# Patient Record
Sex: Male | Born: 1995 | Race: White | Hispanic: No | Marital: Single | Smoking: Never smoker
Health system: Southern US, Community
[De-identification: ages and names within clinical notes are randomized; demographics above are authoritative.]

## PROBLEM LIST (undated history)

## (undated) DIAGNOSIS — F988 Other specified behavioral and emotional disorders with onset usually occurring in childhood and adolescence: Secondary | ICD-10-CM

## (undated) HISTORY — DX: Other specified behavioral and emotional disorders with onset usually occurring in childhood and adolescence: F98.8

---
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
## 2018-10-13 ENCOUNTER — Emergency Department: Payer: BLUE CROSS/BLUE SHIELD

## 2018-10-13 ENCOUNTER — Other Ambulatory Visit: Payer: Self-pay

## 2018-10-13 ENCOUNTER — Encounter: Payer: Self-pay | Admitting: Emergency Medicine

## 2018-10-13 ENCOUNTER — Emergency Department
Admission: EM | Admit: 2018-10-13 | Discharge: 2018-10-13 | Disposition: A | Discharge status: Home / Self Care | Payer: BLUE CROSS/BLUE SHIELD | Attending: Emergency Medicine | Admitting: Emergency Medicine

## 2018-10-13 DIAGNOSIS — T3995XA Adverse effect of unspecified nonopioid analgesic, antipyretic and antirheumatic, initial encounter: Secondary | ICD-10-CM | POA: Diagnosis not present

## 2018-10-13 DIAGNOSIS — K5900 Constipation, unspecified: Secondary | ICD-10-CM | POA: Diagnosis present

## 2018-10-13 DIAGNOSIS — K5903 Drug induced constipation: Secondary | ICD-10-CM | POA: Diagnosis not present

## 2018-10-13 LAB — COMPREHENSIVE METABOLIC PANEL
ALK PHOS: 55 U/L (ref 38–126)
ALT: 108 U/L — ABNORMAL HIGH (ref 0–44)
AST: 68 U/L — ABNORMAL HIGH (ref 15–41)
Albumin: 4.6 g/dL (ref 3.5–5.0)
Anion gap: 9 (ref 5–15)
BUN: 17 mg/dL (ref 6–20)
CO2: 27 mmol/L (ref 22–32)
Calcium: 9.4 mg/dL (ref 8.9–10.3)
Chloride: 101 mmol/L (ref 98–111)
Creatinine, Ser: 1.13 mg/dL (ref 0.61–1.24)
GFR calc Af Amer: >60 mL/min (ref >60)
GFR calc non Af Amer: >60 mL/min (ref >60)
Glucose, Bld: 94 mg/dL (ref 70–99)
Potassium: 3.6 mmol/L (ref 3.5–5.1)
Sodium: 137 mmol/L (ref 135–145)
Total Bilirubin: 1.2 mg/dL (ref 0.3–1.2)
Total Protein: 8.2 g/dL — ABNORMAL HIGH (ref 6.5–8.1)

## 2018-10-13 LAB — CBC
HCT: 49.9 % (ref 39.0–52.0)
Hemoglobin: 17.3 g/dL — ABNORMAL HIGH (ref 13.0–17.0)
MCH: 31.1 pg (ref 26.0–34.0)
MCHC: 34.7 g/dL (ref 30.0–36.0)
MCV: 89.7 fL (ref 80.0–100.0)
Platelets: 282 10*3/uL (ref 150–400)
RBC: 5.56 MIL/uL (ref 4.22–5.81)
RDW: 11.8 % (ref 11.5–15.5)
WBC: 14.3 10*3/uL — ABNORMAL HIGH (ref 4.0–10.5)
nRBC: 0 % (ref 0.0–0.2)

## 2018-10-13 LAB — LIPASE, BLOOD: Lipase: 47 U/L (ref 11–51)

## 2018-10-13 MED ORDER — MAGNESIUM CITRATE PO SOLN
1.0000 | Freq: Once | ORAL | Status: AC
  Administered 2018-10-13: 1 via ORAL

## 2018-10-13 MED ORDER — MAGNESIUM CITRATE PO SOLN
1.0000 | Freq: Once | ORAL | Status: AC
  Administered 2018-10-13: 1 via ORAL
  Filled 2018-10-13: qty 296

## 2018-10-13 MED ORDER — POLYETHYLENE GLYCOL 3350 17 G PO PACK: 17.0000 g | PACK | Freq: Every day | ORAL | 0 refills | Status: DC

## 2018-10-13 MED ORDER — MAGNESIUM CITRATE PO SOLN
ORAL | Status: AC
  Administered 2018-10-13: 1 via ORAL
  Filled 2018-10-13: qty 296

## 2018-10-13 MED ORDER — DOCUSATE SODIUM 100 MG PO CAPS
100.0000 mg | ORAL_CAPSULE | Freq: Once | ORAL | Status: AC
  Administered 2018-10-13: 100 mg via ORAL

## 2018-10-13 MED ORDER — DOCUSATE SODIUM 100 MG PO CAPS
ORAL_CAPSULE | ORAL | Status: AC
  Administered 2018-10-13: 100 mg via ORAL
  Filled 2018-10-13: qty 1

## 2018-10-13 NOTE — ED Notes (Signed)
Pt has left shoulder surgery 5 days ago.  Pt is wearing a brace/sling to left shoulder.  Pt reports he has been taking percocet and has had constipation and diarrhea.  Pt states he now has rectal pain from straining. Vomited x 1.  Pt used enema x 1 with no results.  Pt denies abd pain.  Family with pt.

## 2018-10-13 NOTE — ED Notes (Signed)
Pt verbalized a desire to go home. Papers printed and instructions given on home care. Parents verbalized understanding. Pt ambulatory.

## 2018-10-13 NOTE — ED Notes (Signed)
Enema given pt was able to tolerate half the soap suds enema. Pt attempting BM at this time.

## 2018-10-13 NOTE — ED Triage Notes (Signed)
Diarrhea x 2 days, nausea with vomiting today. Denies abdominal. Has not been around anyone sick. L shoulder surgery in [place] 4 days ago.

## 2018-10-13 NOTE — ED Provider Notes (Signed)
[facility] Emergency Department Provider Note   ____________________________________________    I have reviewed the triage vital signs and the nursing notes.   HISTORY  Chief Complaint Diarrhea     HPI Jerry Collins is a 22 y.o. male who presents with complaints of constipation.  Patient reports he had shoulder surgery 4 days ago, has been on pain medication and has been taking laxatives as prescribed.  Over the last day he has felt like he has had to strain significantly and feels as though he may have a hard ball of stool in his rectum which looser stool is going around.  Denies nausea or vomiting.  No fevers.  Tried an enema at home unsuccessfully   Past Medical History:  Diagnosis Date  . ADD (attention deficit disorder)     There are no active problems to display for this patient.   History reviewed. No pertinent surgical history.  Prior to Admission medications   Medication Sig Start Date End Date Taking? Authorizing Provider  dexmethylphenidate (FOCALIN) 10 MG tablet Take 10 mg by mouth 2 (two) times daily.    [provider]  polyethylene glycol (MIRALAX) packet Take 17 g by mouth daily. 10/13/18   Jene Every, MD  terbinafine (LAMISIL) 250 MG tablet Take 1 tablet (250 mg total) by mouth daily. 06/11/14   Lenn Sink, DPM     Allergies Patient has no known allergies.  No family history on file.  Social History Social History   Tobacco Use  . Smoking status: Never Smoker  . Smokeless tobacco: Never Used  Substance Use Topics  . Alcohol use: Not on file  . Drug use: Not on file    Review of Systems  Constitutional: No fever/chills Eyes: No visual changes.  ENT: No sore throat. Cardiovascular: Denies chest pain. Respiratory: Denies shortness of breath. Gastrointestinal: No abdominal pain, constipation as above Genitourinary: No difficulty urinating Musculoskeletal: Shoulder pain status post surgery Skin:  Negative for rash. Neurological: Negative for headaches    ____________________________________________   PHYSICAL EXAM:  VITAL SIGNS: ED Triage Vitals  Enc Vitals Group     BP 10/13/18 1911 (!) 153/91     Pulse Rate 10/13/18 1911 100     Resp 10/13/18 1911 20     Temp 10/13/18 1911 98.5 F (36.9 C)     Temp Source 10/13/18 1911 Oral     SpO2 10/13/18 1911 100 %     Weight 10/13/18 1912 122.5 kg (270 lb)     Height 10/13/18 1912 1.829 m (6')     Head Circumference --      Peak Flow --      Pain Score 10/13/18 1911 0     Pain Loc --      Pain Edu? --      Excl. in GC? --     Constitutional: Alert and oriented. No acute distress.  Eyes: Conjunctivae are normal.   Nose: No congestion/rhinnorhea. Mouth/Throat: Mucous membranes are moist.    Cardiovascular: Normal rate, regular rhythm. Grossly normal heart sounds.  Good peripheral circulation. Respiratory: Normal respiratory effort.  No retractions. Lungs CTAB. Gastrointestinal: Soft and nontender. No distention.  Hemorrhoid 6:00, nonbleeding  Musculoskeletal:   Warm and well perfused Neurologic:  Normal speech and language. No gross focal neurologic deficits are appreciated.  Skin:  Skin is warm, dry and intact. No rash noted. Psychiatric: Mood and affect are normal. Speech and behavior are normal.  ____________________________________________   LABS (  all labs ordered are listed, but only abnormal results are displayed)  Labs Reviewed  COMPREHENSIVE METABOLIC PANEL - Abnormal; Notable for the following components:      Result Value   Total Protein 8.2 (*)    AST 68 (*)    ALT 108 (*)    All other components within normal limits  CBC - Abnormal; Notable for the following components:   WBC 14.3 (*)    Hemoglobin 17.3 (*)    All other components within normal limits  LIPASE, BLOOD   ____________________________________________  EKG  None ____________________________________________  RADIOLOGY  Chest  x-ray demonstrates moderate large bowel stool ____________________________________________   PROCEDURES  Procedure(s) performed: no     ------------------------------------------------------------------------------------------------------------------    Procedures   Critical Care performed: No ____________________________________________   INITIAL IMPRESSION / ASSESSMENT AND PLAN / ED COURSE  Pertinent labs & imaging results that were available during my care of the patient were reviewed by me and considered in my medical decision making (see chart for details).  Patient presents with complaints of constipation, he is worried about possible impaction.  KUB demonstrates moderate amount of stool, will give enema.  Attempted manual disimpaction unsuccessfully, patient unable to tolerate.  P.o. magnesium citrate given    ____________________________________________   FINAL CLINICAL IMPRESSION(S) / ED DIAGNOSES  Final diagnoses:  Drug-induced constipation        Note:  This document was prepared using Dragon voice recognition software and may include unintentional dictation errors.    Jene EveryKinner, Derrion Tritz, MD 10/13/18 2242

## 2020-01-03 ENCOUNTER — Ambulatory Visit: Payer: Self-pay | Attending: Internal Medicine

## 2020-01-03 DIAGNOSIS — Z23 Encounter for immunization: Secondary | ICD-10-CM | POA: Insufficient documentation

## 2020-01-03 NOTE — Progress Notes (Signed)
   Covid-19 Vaccination Clinic  Name:  DAMIEAN LUKES    MRN: [id number] DOB: 28-Apr-1996  01/03/2020  Mr. Schleicher was observed post Covid-19 immunization for 15 minutes without incidence. He was provided with Vaccine Information Sheet and instruction to access the V-Safe system.   Mr. Gascoigne was instructed to call 911 with any severe reactions post vaccine: Marland Kitchen Difficulty breathing  . Swelling of your face and throat  . A fast heartbeat  . A bad rash all over your body  . Dizziness and weakness    Immunizations Administered    Name Date Dose VIS Date Route   Pfizer COVID-19 Vaccine 01/03/2020  4:34 PM 0.3 mL 10/16/2019 Intramuscular   Manufacturer: [employer], [employer]   Lot: [id number]   NDC: [address]-9

## 2020-01-26 ENCOUNTER — Ambulatory Visit: Payer: Self-pay | Attending: Internal Medicine

## 2020-01-26 DIAGNOSIS — Z23 Encounter for immunization: Secondary | ICD-10-CM

## 2020-01-26 NOTE — Progress Notes (Signed)
   Covid-19 Vaccination Clinic  Name:  Jerry Collins    MRN: [id number] DOB: [birth year]  01/26/2020  Mr. Hrdlicka was observed post Covid-19 immunization for 15 minutes without incident. He was provided with Vaccine Information Sheet and instruction to access the V-Safe system.   Mr. Dunnam was instructed to call 911 with any severe reactions post vaccine: Marland Kitchen Difficulty breathing  . Swelling of face and throat  . A fast heartbeat  . A bad rash all over body  . Dizziness and weakness   Immunizations Administered    Name Date Dose VIS Date Route   Pfizer COVID-19 Vaccine 01/26/2020  1:52 PM 0.3 mL 10/16/2019 Intramuscular   Manufacturer: [employer], [employer]   Lot: [id number]   NDC: [address]-5

## 2020-08-13 ENCOUNTER — Encounter: Payer: Self-pay | Admitting: Emergency Medicine

## 2020-08-13 ENCOUNTER — Other Ambulatory Visit: Payer: Self-pay

## 2020-08-13 DIAGNOSIS — R1032 Left lower quadrant pain: Secondary | ICD-10-CM | POA: Insufficient documentation

## 2020-08-13 DIAGNOSIS — E876 Hypokalemia: Secondary | ICD-10-CM | POA: Diagnosis not present

## 2020-08-13 NOTE — ED Triage Notes (Addendum)
Pt presents to ER from home with complaints of LLQ pain since Thursday, pt reports he has had normal BM today reports pain comes and goes denies any injury to area. Pt denies any nausea, vomiting or diarrhea. Pt talks in complete sentences no distress noted.

## 2020-08-14 ENCOUNTER — Emergency Department
Admission: EM | Admit: 2020-08-14 | Discharge: 2020-08-14 | Disposition: A | Discharge status: Home / Self Care | Payer: BC Managed Care – PPO | Attending: Emergency Medicine | Admitting: Emergency Medicine

## 2020-08-14 ENCOUNTER — Emergency Department: Payer: BC Managed Care – PPO

## 2020-08-14 DIAGNOSIS — R1032 Left lower quadrant pain: Secondary | ICD-10-CM

## 2020-08-14 LAB — COMPREHENSIVE METABOLIC PANEL
ALT: 30 U/L (ref 0–44)
AST: 17 U/L (ref 15–41)
Albumin: 4.6 g/dL (ref 3.5–5.0)
Alkaline Phosphatase: 56 U/L (ref 38–126)
Anion gap: 8 (ref 5–15)
BUN: 13 mg/dL (ref 6–20)
CO2: 25 mmol/L (ref 22–32)
Calcium: 8.7 mg/dL — ABNORMAL LOW (ref 8.9–10.3)
Chloride: 106 mmol/L (ref 98–111)
Creatinine, Ser: 1.05 mg/dL (ref 0.61–1.24)
GFR, Estimated: >60 mL/min (ref >60)
Glucose, Bld: 94 mg/dL (ref 70–99)
Potassium: 3.3 mmol/L — ABNORMAL LOW (ref 3.5–5.1)
Sodium: 139 mmol/L (ref 135–145)
Total Bilirubin: 1.7 mg/dL — ABNORMAL HIGH (ref 0.3–1.2)
Total Protein: 7.8 g/dL (ref 6.5–8.1)

## 2020-08-14 LAB — URINALYSIS, COMPLETE (UACMP) WITH MICROSCOPIC
Bilirubin Urine: NEGATIVE
Glucose, UA: NEGATIVE mg/dL
Hgb urine dipstick: NEGATIVE
Ketones, ur: NEGATIVE mg/dL
Leukocytes,Ua: NEGATIVE
Nitrite: NEGATIVE
Protein, ur: NEGATIVE mg/dL
Specific Gravity, Urine: 1.026 (ref 1.005–1.030)
Squamous Epithelial / LPF: NONE SEEN (ref 0–5)
pH: 5 (ref 5.0–8.0)

## 2020-08-14 LAB — CBC
HCT: 41.1 % (ref 39.0–52.0)
Hemoglobin: 15 g/dL (ref 13.0–17.0)
MCH: 31.8 pg (ref 26.0–34.0)
MCHC: 36.5 g/dL — ABNORMAL HIGH (ref 30.0–36.0)
MCV: 87.3 fL (ref 80.0–100.0)
Platelets: 184 10*3/uL (ref 150–400)
RBC: 4.71 MIL/uL (ref 4.22–5.81)
RDW: 12 % (ref 11.5–15.5)
WBC: 9.7 10*3/uL (ref 4.0–10.5)
nRBC: 0 % (ref 0.0–0.2)

## 2020-08-14 LAB — LIPASE, BLOOD: Lipase: 27 U/L (ref 11–51)

## 2020-08-14 MED ORDER — TRAMADOL HCL 50 MG PO TABS: 50.0000 mg | ORAL_TABLET | Freq: Four times a day (QID) | ORAL | 0 refills | Status: DC | PRN

## 2020-08-14 MED ORDER — SODIUM CHLORIDE 0.9 % IV BOLUS
1000.0000 mL | Freq: Once | INTRAVENOUS | Status: AC
  Administered 2020-08-14: 1000 mL via INTRAVENOUS

## 2020-08-14 MED ORDER — IOHEXOL 300 MG/ML  SOLN
100.0000 mL | Freq: Once | INTRAMUSCULAR | Status: AC | PRN
  Administered 2020-08-14: 100 mL via INTRAVENOUS
  Filled 2020-08-14: qty 100

## 2020-08-14 MED ORDER — CIPROFLOXACIN HCL 500 MG PO TABS: 500.0000 mg | ORAL_TABLET | Freq: Two times a day (BID) | ORAL | 0 refills | Status: DC

## 2020-08-14 MED ORDER — METRONIDAZOLE 500 MG PO TABS: 500.0000 mg | ORAL_TABLET | Freq: Three times a day (TID) | ORAL | 0 refills | Status: AC

## 2020-08-14 MED ORDER — FENTANYL CITRATE (PF) 100 MCG/2ML IJ SOLN
100.0000 ug | Freq: Once | INTRAMUSCULAR | Status: AC
  Administered 2020-08-14: 100 ug via INTRAVENOUS
  Filled 2020-08-14: qty 2

## 2020-08-14 MED ORDER — IOHEXOL 9 MG/ML PO SOLN
500.0000 mL | Freq: Two times a day (BID) | ORAL | Status: DC | PRN
  Administered 2020-08-14: 500 mL via ORAL
  Filled 2020-08-14 (×2): qty 500

## 2020-08-14 MED ORDER — ONDANSETRON HCL 4 MG/2ML IJ SOLN
4.0000 mg | Freq: Once | INTRAMUSCULAR | Status: AC
  Administered 2020-08-14: 4 mg via INTRAVENOUS
  Filled 2020-08-14: qty 2

## 2020-08-14 NOTE — Discharge Instructions (Signed)
Please call and schedule a follow up appointment with the GI specialist.   Take the antibiotic as prescribed and until finished.   Return to the ER for symptoms that change or worsen if unable to see primary care or the GI specialist.

## 2020-08-14 NOTE — ED Provider Notes (Signed)
[facility] Emergency Department Provider Note ____________________________________________   First MD Initiated Contact with Patient 08/14/20 1112     (approximate)  I have reviewed the triage vital signs and the nursing notes.   HISTORY  Chief Complaint Abdominal Pain  HPI Jerry Collins is a 24 y.o. male with no chronic medical history presents to the emergency department for treatment and evaluation of left lower quadrant pain that started 2 days ago.  Patient describes the pain as stabbing.  He denies nausea or vomiting or diarrhea.  No previous symptoms of the same. He denies fever. Last BM was yesterday. He initially thought pain was related to gas, but tried Gax X without relief.          Past Medical History:  Diagnosis Date  . ADD (attention deficit disorder)     There are no problems to display for this patient.   History reviewed. No pertinent surgical history.  Prior to Admission medications   Medication Sig Start Date End Date Taking? Authorizing Provider  dexmethylphenidate (FOCALIN) 10 MG tablet Take 10 mg by mouth 2 (two) times daily.    [provider]  polyethylene glycol (MIRALAX) packet Take 17 g by mouth daily. 10/13/18   Jene Every, MD  terbinafine (LAMISIL) 250 MG tablet Take 1 tablet (250 mg total) by mouth daily. 06/11/14   Lenn Sink, DPM    Allergies Patient has no known allergies.  No family history on file.  Social History Social History   Tobacco Use  . Smoking status: Never Smoker  . Smokeless tobacco: Never Used  Substance Use Topics  . Alcohol use: Not on file  . Drug use: Not on file    Review of Systems  Constitutional: No fever/chills Eyes: No visual changes. ENT: No sore throat. Cardiovascular: Denies chest pain. Respiratory: Denies shortness of breath. Gastrointestinal: Positive for abdominal pain.  No nausea, no vomiting.  No diarrhea.  No constipation. Genitourinary: Negative  for dysuria. Musculoskeletal: Negative for back pain. Skin: Negative for rash. Neurological: Negative for headaches, focal weakness or numbness. ____________________________________________   PHYSICAL EXAM:  VITAL SIGNS: ED Triage Vitals  Enc Vitals Group     BP 08/13/20 2351 131/83     Pulse Rate 08/13/20 2351 98     Resp 08/13/20 2351 20     Temp 08/13/20 2351 99.8 F (37.7 C)     Temp Source 08/13/20 2351 Oral     SpO2 08/13/20 2351 97 %     Weight 08/13/20 2352 300 lb (136.1 kg)     Height 08/13/20 2352 6' (1.829 m)     Head Circumference --      Peak Flow --      Pain Score 08/13/20 2351 6     Pain Loc --      Pain Edu? --      Excl. in GC? --     Constitutional: Alert and oriented. Well appearing and in no acute distress. Eyes: Conjunctivae are normal.  Head: Atraumatic. Nose: No congestion/rhinnorhea. Mouth/Throat: Mucous membranes are moist. Oropharynx non-erythematous. Neck: No stridor.   Hematological/Lymphatic/Immunilogical: No cervical lymphadenopathy. Cardiovascular: Normal rate, regular rhythm. Grossly normal heart sounds.  Good peripheral circulation. Respiratory: Normal respiratory effort.  No retractions. Lungs CTAB. Gastrointestinal: Soft and nontender. No distention. No abdominal bruits. No CVA tenderness. Genitourinary: Exam deferred. Musculoskeletal: No lower extremity tenderness nor edema.  No joint effusions. Neurologic:  Normal speech and language. No gross focal neurologic deficits are appreciated.  No gait instability. Skin:  Skin is warm, dry and intact. No rash noted. Psychiatric: Mood and affect are normal. Speech and behavior are normal.  ____________________________________________   LABS (all labs ordered are listed, but only abnormal results are displayed)  Labs Reviewed  COMPREHENSIVE METABOLIC PANEL - Abnormal; Notable for the following components:      Result Value   Potassium 3.3 (*)    Calcium 8.7 (*)    Total Bilirubin 1.7  (*)    All other components within normal limits  CBC - Abnormal; Notable for the following components:   MCHC 36.5 (*)    All other components within normal limits  URINALYSIS, COMPLETE (UACMP) WITH MICROSCOPIC - Abnormal; Notable for the following components:   Color, Urine YELLOW (*)    APPearance CLOUDY (*)    Bacteria, UA RARE (*)    All other components within normal limits  LIPASE, BLOOD   ____________________________________________  EKG  Not indicated. ____________________________________________  RADIOLOGY  ED MD interpretation:    Focal diverticulitis v/s colitis found on CT abdomen and pelvis.  I, Kem Boroughs, personally viewed and evaluated these images (plain radiographs) as part of my medical decision making, as well as reviewing the written report by the radiologist.  Official radiology report(s): No results found.  ____________________________________________   PROCEDURES  Procedure(s) performed (including Critical Care):  Procedures  ____________________________________________   INITIAL IMPRESSION / ASSESSMENT AND PLAN     24 year old male presenting to the emergency department for treatment and evaluation of sudden onset left lower quadrant pain.  See HPI for further details.  Vital signs are reassuring.  Patient is not febrile or tachycardic.  Plan will be to review lab studies that were obtained while awaiting ER room assignment and decide a plan of care.  DIFFERENTIAL DIAGNOSIS  Diverticulitis, diverticulosis, colitis, constipation, partial/small bowel obstruction  ED COURSE  Lab studies are reassuring.  There is no leukocytosis.  He does have very mild hypokalemia as well as a slight bump in the total bilirubin.  Lipase is normal.  Vital signs have remained stable throughout his time in the emergency department.  CT scan of the abdomen and pelvis with oral and IV contrast does show concern for focal diverticulitis or focal colitis.  He  will be treated with Cipro and Flagyl and encouraged to schedule a follow-up appointment with gastroenterologist.  He was advised to return to the emergency department for symptoms of change or worsen if he is unable to see his primary care provider or the specialist.    ___________________________________________   FINAL CLINICAL IMPRESSION(S) / ED DIAGNOSES  Final diagnoses:  None     ED Discharge Orders    None       Jerry Collins was evaluated in Emergency Department on 08/14/2020 for the symptoms described in the history of present illness. He was evaluated in the context of the global COVID-19 pandemic, which necessitated consideration that the patient might be at risk for infection with the SARS-CoV-2 virus that causes COVID-19. Institutional protocols and algorithms that pertain to the evaluation of patients at risk for COVID-19 are in a state of rapid change based on information released by regulatory bodies including the CDC and federal and state organizations. These policies and algorithms were followed during the patient's care in the ED.   Note:  This document was prepared using Dragon voice recognition software and may include unintentional dictation errors.   Chinita Pester, FNP 08/14/20 1755    Shaune Pollack,  MD 08/14/20 2135

## 2020-08-14 NOTE — ED Provider Notes (Signed)
Medical screening examination/treatment/procedure(s) were conducted as a shared visit with non-physician practitioner(s) and myself.  I personally evaluated the patient during the encounter. Briefly, the patient is a 24 yo M here with LLQ abd pain. Labs overall reassuring, pt well appearing on exam. CT scan obtained 2/2 focal TTP reviewed by me, c/w left-sided colitis vs diverticulitis. Pt did report prior ABX use but sx are less c/w C. Diff and he has no leukocytosis, fever. Will treat, d/c with good return precautions.     Shaune Pollack, MD 08/14/20 2131

## 2020-08-14 NOTE — ED Notes (Signed)
Called CT to pick pt up for scan

## 2021-06-30 IMAGING — CT CT ABD-PELV W/ CM
2 of 4 series · 16 of 46 positions shown, 18 images · IV contrast (APPLIED)
Comparison: None.

CLINICAL DATA: Left lower quadrant abdomen pain since [REDACTED].
Assess for diverticulitis.

EXAM:
CT ABDOMEN AND PELVIS WITH CONTRAST
TECHNIQUE: Multidetector CT imaging of the abdomen and pelvis was performed
using the standard protocol following bolus administration of
intravenous contrast.
CONTRAST:  100mL OMNIPAQUE IOHEXOL 300 MG/ML  SOLN

[Series 2: axial st · axial · 0.83mm/px · z∈[-1125,-615]mm · 13 of 112 slices shown, 15 images]
[im 5/112  soft-tissue]
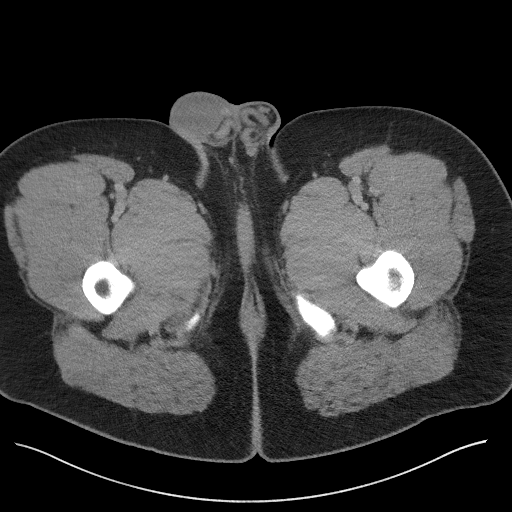
[im 5/112  bone]
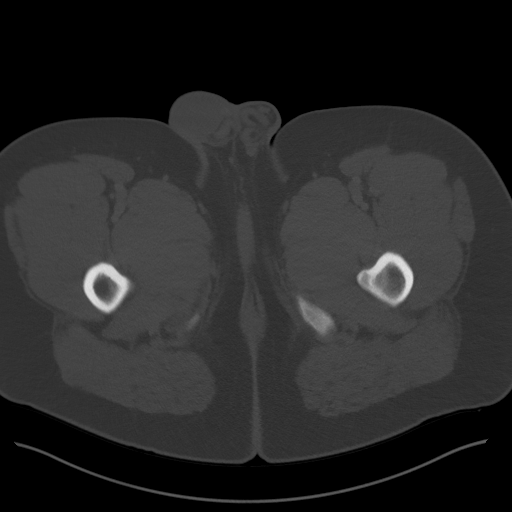
[im 14/112  soft-tissue]
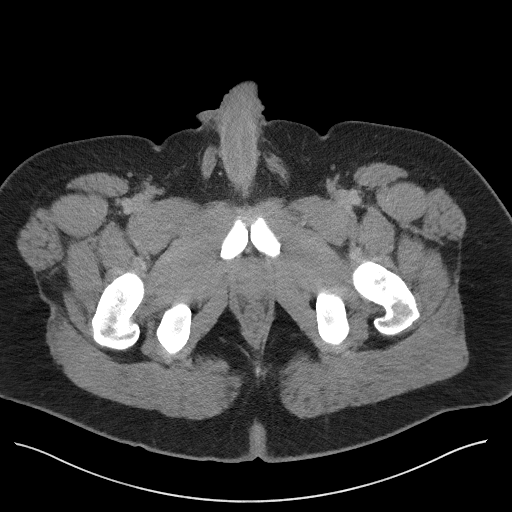
[im 24/112  soft-tissue]
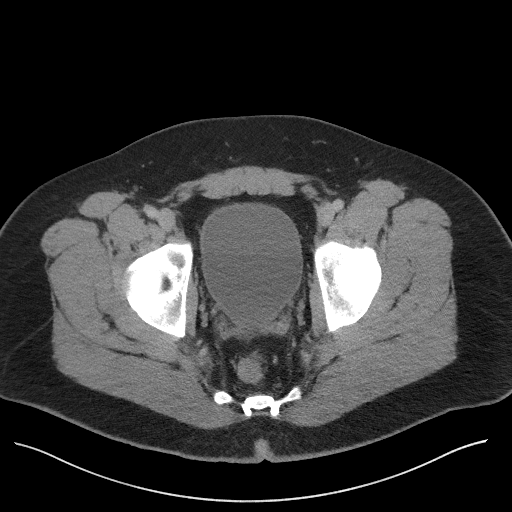
[im 33/112  soft-tissue]
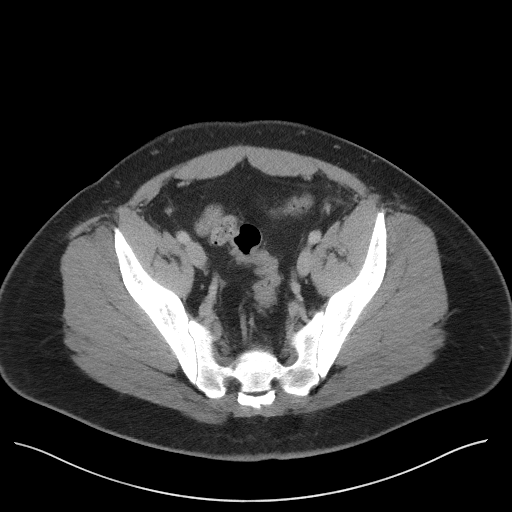
[im 38/112  soft-tissue]
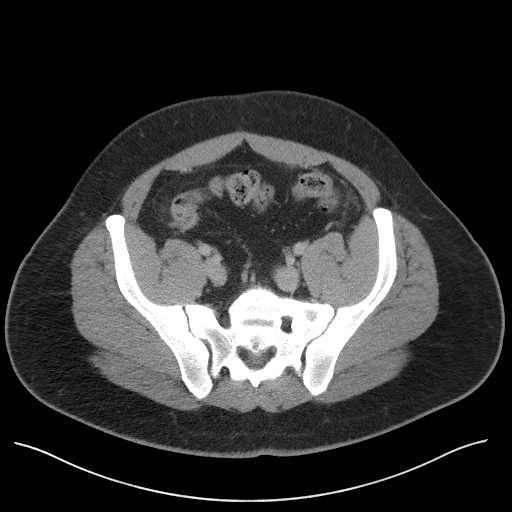
[im 47/112  soft-tissue]
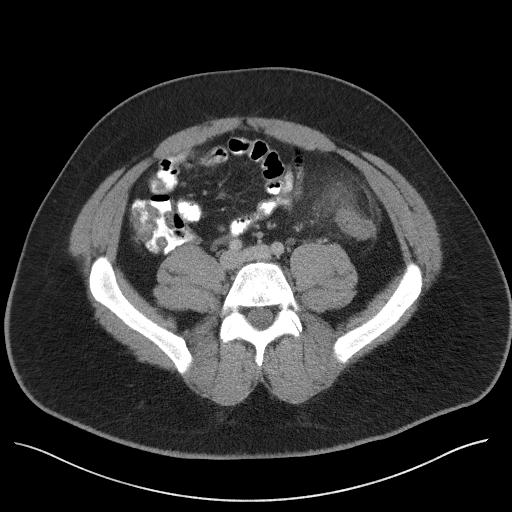
[im 56/112  soft-tissue]
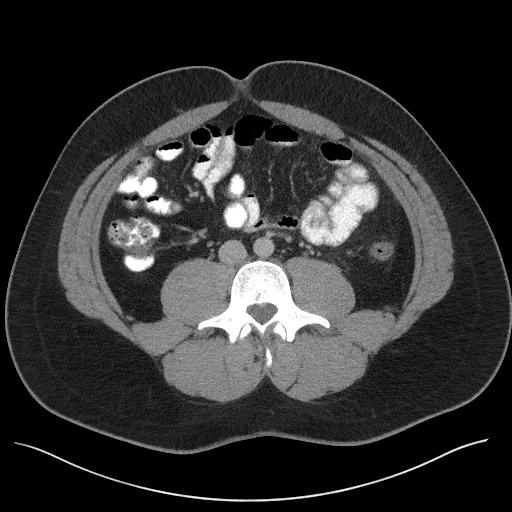
[im 65/112  soft-tissue]
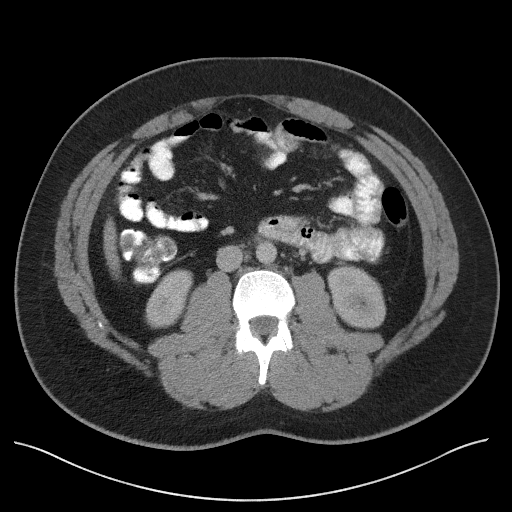
[im 75/112  soft-tissue]
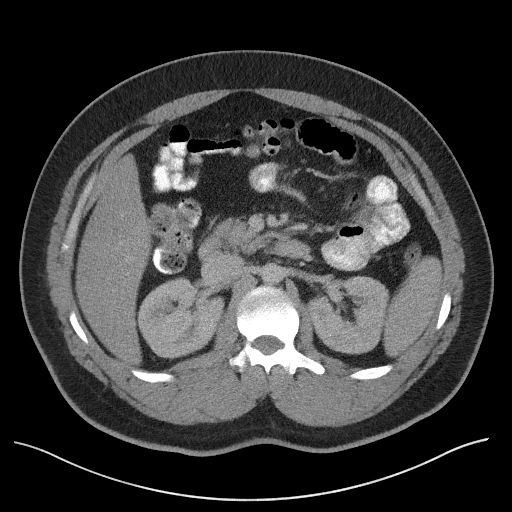
[im 75/112  bone]
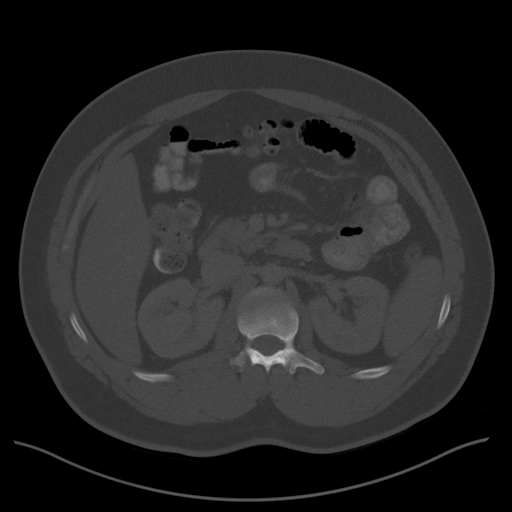
[im 79/112  soft-tissue]
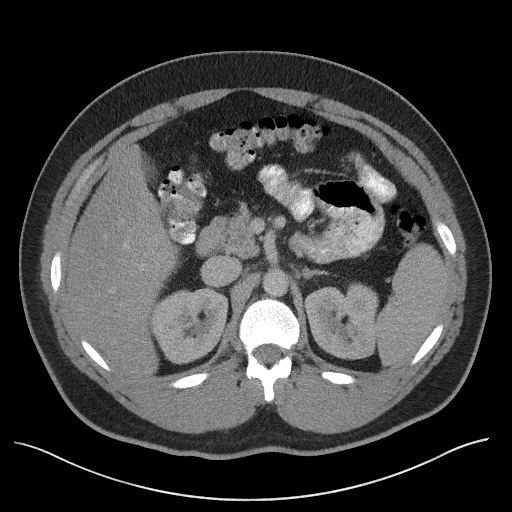
[im 88/112  soft-tissue]
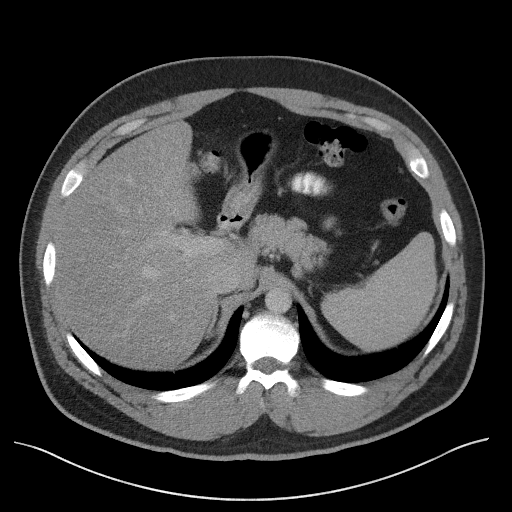
[im 98/112  soft-tissue]
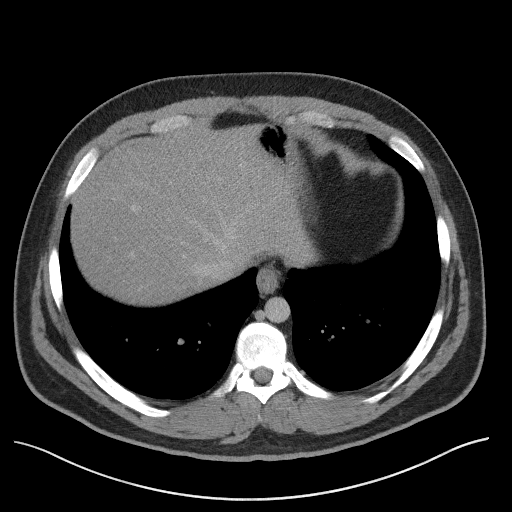
[im 107/112  soft-tissue]
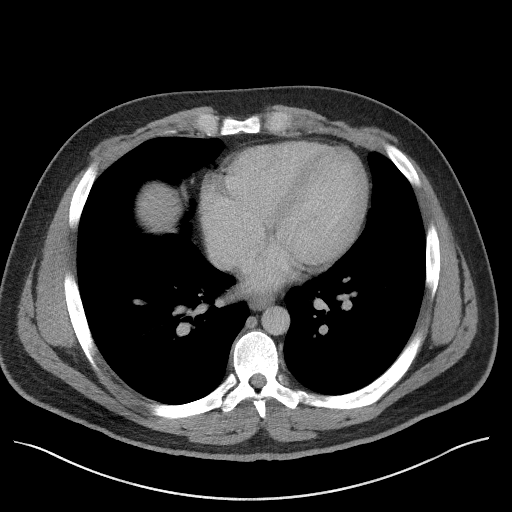

[Series 5: coronal st · coronal · 0.95mm/px · 3 of 116 slices shown]
[im 39/116  soft-tissue]
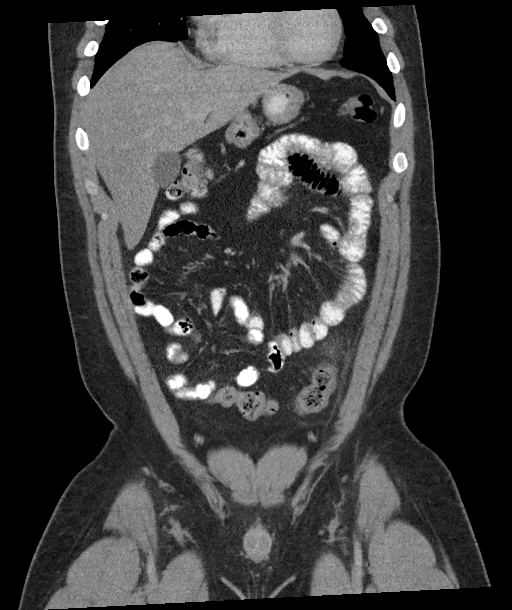
[im 52/116  soft-tissue]
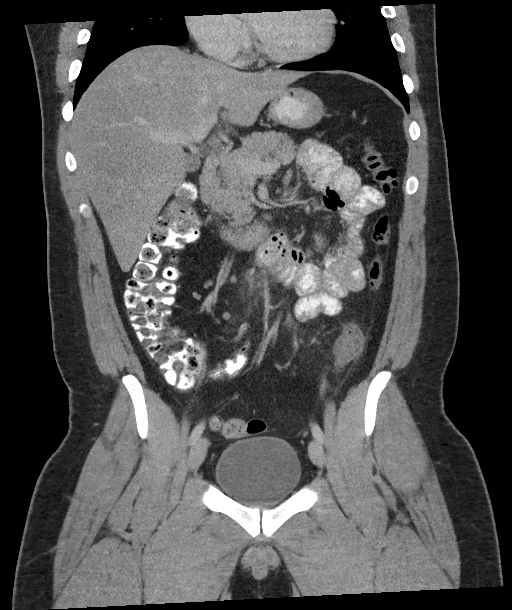
[im 64/116  soft-tissue]
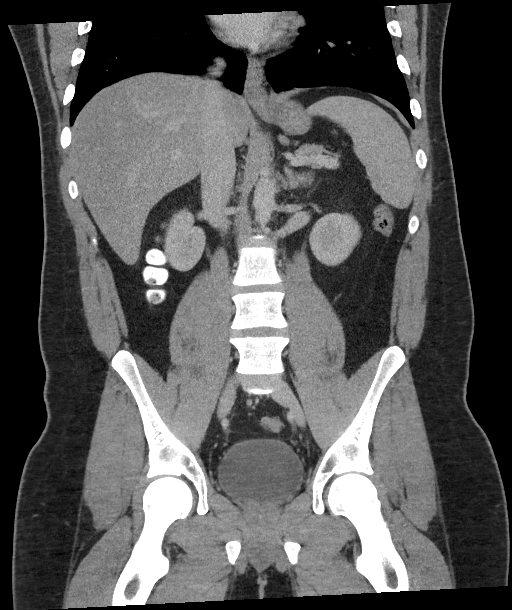

[16 of 46 positions shown; findings below may reference images not displayed]

FINDINGS: Lower chest: No acute abnormality.

Hepatobiliary: Diffuse low density of the liver is identified. No
focal liver lesion is noted. The gallbladder is normal. The biliary
tree is normal.

Pancreas: Unremarkable. No pancreatic ductal dilatation or
surrounding inflammatory changes.

Spleen: Normal in size without focal abnormality.

Adrenals/Urinary Tract: Adrenal glands are unremarkable. Kidneys are
normal, without renal calculi, focal lesion, or hydronephrosis.
Bladder is unremarkable.

Stomach/Bowel: There is inflammation surrounding the proximal
sigmoid colon. There is no small bowel obstruction. The appendix is
normal. No significant colonic diverticular disease is noted.

Vascular/Lymphatic: No significant vascular findings are present. No
enlarged abdominal or pelvic lymph nodes.

Reproductive: Prostate is unremarkable.

Other: Minimal umbilical herniation of mesenteric fat is identified.

Musculoskeletal: No acute or significant osseous findings.
IMPRESSION: 1. Inflammation surrounding the proximal sigmoid colon either due to
focal diverticulitis or focal colitis.
2. Fatty infiltration of liver.

## 2022-04-04 ENCOUNTER — Ambulatory Visit: Payer: BLUE CROSS/BLUE SHIELD | Admitting: Podiatry

## 2024-06-26 NOTE — Progress Notes (Signed)
 CC: Preventative Health Exam  HPI  Chanan Mikita is a 28 y.o. here for preventative health exam  Preventative health exam: Has ongoing acute on chronic L shoulder pain that is under worker's comp and followed by [facility] orthopedics.  Chronic medical issues stable and tolerating medications without adverse effects.  ADD followed by [facility].  Exercises regularly and tries to adhere to healthy diet.  No exertional cp or syncopal episodes.  No urinary issues or rectal pain/bleeding.  Denies any tobacco use.    ROS Review of systems is unremarkable for any active cardiac, respiratory, GI, GU, hematologic, neurologic, dermatologic, HEENT, or psychiatric symptoms except as noted above.  No fevers, chills, or constitutional symptoms.   Current Outpatient Medications  Medication Sig Dispense Refill   fluticasone propionate (FLONASE) 50 mcg/actuation nasal spray Place 2 sprays into both nostrils once daily as needed     lisdexamfetamine (VYVANSE) 60 MG capsule Take 60 mg by mouth every morning     meloxicam (MOBIC) 15 MG tablet Take 1 tablet (15 mg total) by mouth daily with breakfast (Patient taking differently: Take 15 mg by mouth once daily as needed) 21 tablet 0   metaxalone (SKELAXIN) 800 mg tablet Take 1 tablet (800 mg total) by mouth 3 (three) times daily as needed for Pain (for pain or spasm) for up to 20 doses 20 tablet 0   omeprazole (PRILOSEC) 40 MG DR capsule Take 1 capsule (40 mg total) by mouth once daily as needed 90 capsule 1   No current facility-administered medications for this visit.    Allergies as of 06/26/2024   (No Known Allergies)    Patient Active Problem List  Diagnosis   Obesity, Class III, BMI 40-49.9 (morbid obesity)   History of ADHD previously on strattera (started 2020)   GERD without esophagitis   Chronic left shoulder pain   Diverticula of colon    Past Medical History:  Diagnosis Date   ADHD     Past Surgical History:   Procedure Laterality Date   shoulder surgery  Left 2014   2019, labral tear repair    Family History  Problem Relation Name Age of Onset   No Known Problems Mother     Diabetes type II Father      Social History   Socioeconomic History   Marital status: Married  Occupational History   Occupation: Marine Scientist   Tobacco Use   Smoking status: Never    Passive exposure: Never   Smokeless tobacco: Former   Tobacco comments:    Occasional zen/smokeless   Vaping Use   Vaping status: Never Used  Substance and Sexual Activity   Alcohol use: Yes    Comment: socially   Drug use: Never   Sexual activity: Never   Social Drivers of Corporate Investment Banker Strain: Low Risk  (06/23/2024)   Overall Financial Resource Strain (CARDIA)    Difficulty of Paying Living Expenses: Not hard at all  Food Insecurity: No Food Insecurity (06/23/2024)   Hunger Vital Sign    Worried About Running Out of Food in the Last Year: Never true    Ran Out of Food in the Last Year: Never true  Transportation Needs: No Transportation Needs (06/23/2024)   PRAPARE - Administrator, Civil Service (Medical): No    Lack of Transportation (Non-Medical): No  Housing Stability: Low Risk  (06/23/2024)   Housing Stability Vital Sign    Unable to Pay for Housing in the  Last Year: No    Number of Times Moved in the Last Year: 0    Homeless in the Last Year: No    Health Maintenance  Topic Date Due   HPV Vaccines (1 - Risk 3-dose SCDM series) Never done   Annual Physical/Well Child Check  06/25/2024   Influenza Vaccine (1) 07/06/2024   COVID-19 Vaccine (1) 06/26/2025 (Originally 01/26/2020)   Depression Screening  06/23/2025   Lipid Panel  06/24/2025   Diabetes Screening  06/25/2027   Adult Tetanus (Td And Tdap)  06/24/2033   Hepatitis C Screen  Completed   Hib Vaccines  Aged Out   Hepatitis A Vaccines  Aged Out   Meningococcal B Vaccine  Aged Out    Meningococcal ACWY Vaccine  Aged Out   Pneumococcal Vaccine  Aged Out   Varicella Vaccines  Discontinued   HIV Screen  Discontinued    Vitals:   06/26/24 0937  BP: 118/86  Pulse: 82  SpO2: 97%  Weight: (!) 135.2 kg (298 lb)  Height: 182.9 cm (6')  PainSc: 0-No pain   Body mass index is 40.42 kg/m.  Exam  General. Well appearing; NAD; VS reviewed     Eyes. Sclera and conjunctiva clear; Vision grossly intact; extraocular movements intact Neck. Supple. No swelling, masses, thyroid normal size, no masses palpated.   Lungs. Respirations unlabored; clear to auscultation bilaterally Cardiovascular. Heart regular rate and rhythm without murmurs, gallops, or rubs Abdomen. Soft; non tender; non distended; normoactive bowel sounds; no masses or organomegaly Lymph Nodes. No significant cervical or supraclavicular lymphadenopathy noted Musculoskeletal. No deformities; no active joint inflammation Extremities. no edema Skin. Normal color and turgor Pulses. Dorsalis pedis palpable and symmetric bilaterally Neurologic. Alert and oriented x3; CN 2-12 grossly intact; no focal deficits  PHQ 2/9 last 3 flowsheet values     06/22/2022 06/25/2023 06/23/2024  PHQ-9 Depression Screening   Little interest or pleasure in doing things  0 0  Feeling down, depressed, or hopeless  0 0  (OBSOLETE) Little interest or pleasure in doing things 0    (OBSOLETE) Feeling down, depressed, or hopeless (or irritable for Teens only)? 0    (OBSOLETE) Total Score = 0        Depression Severity and Treatment Recommendations:  0-4= None  5-9= Mild / Treatment: Support, educate to call if worse; return in one month  10-14= Moderate / Treatment: Support, watchful waiting; Antidepressant or Psychotherapy  15-19= Moderately severe / Treatment: Antidepressant OR Psychotherapy  >= 20 = Major depression, severe / Antidepressant AND Psychotherapy   Assessment and Plan  1. Preventative health exam- Stable exam for  elevated BMI.  Recommend considering a body composition scan in the future.  CV screening labs reviewed with patient.  Hypertension, hyperlipidemia, or diabetes.  CBC wnls.  No indication for early prostate or colon cancer screening. Vaccinations reviewed.  Counseled on nutrition modification and exercise.  2.  Chronic left shoulder pain Acute on chronic.  Followed by [facility] orthopedics but interested in potentially having any procedure that would be done in the future done in [place], [place]  with his previous orthopedist.  Informed him that whenever that happens, he can let me know and I can place a referral for on his behalf.  3.  ADD Stable.  Tolerating medication without significant side effects.  Followed by [facility].  He is requesting additional medication on days that are longer that require more attention.  Advised him to follow-up with his specialist.  Patient  agreeable.   Goals Addressed               This Visit's Progress    * Exercise (x goals) (pt-stated)   On track     3x weekly       * Maintain health/healthy lifestyle (pt-stated)   On track      Follow up: Annually for PE.  Labs prior  [facility], MD *Some images could not be shown.

## 2024-10-23 ENCOUNTER — Other Ambulatory Visit: Payer: Self-pay

## 2024-10-23 ENCOUNTER — Observation Stay: Admitting: Anesthesiology

## 2024-10-23 ENCOUNTER — Observation Stay
Admission: EM | Admit: 2024-10-23 | Discharge: 2024-10-25 | Disposition: A | Discharge status: Home / Self Care | Attending: Surgery | Admitting: Surgery

## 2024-10-23 ENCOUNTER — Emergency Department

## 2024-10-23 ENCOUNTER — Encounter: Admission: EM | Disposition: A | Payer: Self-pay | Source: Home / Self Care | Attending: Emergency Medicine

## 2024-10-23 DIAGNOSIS — K82A2 Perforation of gallbladder in cholecystitis: Secondary | ICD-10-CM | POA: Insufficient documentation

## 2024-10-23 DIAGNOSIS — R1011 Right upper quadrant pain: Secondary | ICD-10-CM | POA: Diagnosis present

## 2024-10-23 DIAGNOSIS — K810 Acute cholecystitis: Secondary | ICD-10-CM | POA: Diagnosis not present

## 2024-10-23 DIAGNOSIS — K8000 Calculus of gallbladder with acute cholecystitis without obstruction: Secondary | ICD-10-CM | POA: Diagnosis not present

## 2024-10-23 LAB — COMPREHENSIVE METABOLIC PANEL WITH GFR
ALT: 29 U/L (ref 0–44)
AST: 19 U/L (ref 15–41)
Albumin: 4.8 g/dL (ref 3.5–5.0)
Alkaline Phosphatase: 67 U/L (ref 38–126)
Anion gap: 14 (ref 5–15)
BUN: 17 mg/dL (ref 6–20)
CO2: 27 mmol/L (ref 22–32)
Calcium: 9.7 mg/dL (ref 8.9–10.3)
Chloride: 101 mmol/L (ref 98–111)
Creatinine, Ser: 1.16 mg/dL (ref 0.61–1.24)
GFR, Estimated: >60 mL/min
Glucose, Bld: 98 mg/dL (ref 70–99)
Potassium: 4 mmol/L (ref 3.5–5.1)
Sodium: 142 mmol/L (ref 135–145)
Total Bilirubin: 0.9 mg/dL (ref 0.0–1.2)
Total Protein: 7.7 g/dL (ref 6.5–8.1)

## 2024-10-23 LAB — CBC
HCT: 47.3 % (ref 39.0–52.0)
Hemoglobin: 16.3 g/dL (ref 13.0–17.0)
MCH: 30.7 pg (ref 26.0–34.0)
MCHC: 34.5 g/dL (ref 30.0–36.0)
MCV: 89.1 fL (ref 80.0–100.0)
Platelets: 246 K/uL (ref 150–400)
RBC: 5.31 MIL/uL (ref 4.22–5.81)
RDW: 12.3 % (ref 11.5–15.5)
WBC: 9.4 K/uL (ref 4.0–10.5)
nRBC: 0 % (ref 0.0–0.2)

## 2024-10-23 LAB — LIPASE, BLOOD: Lipase: 33 U/L (ref 11–51)

## 2024-10-23 SURGERY — CHOLECYSTECTOMY, ROBOT-ASSISTED, LAPAROSCOPIC
Anesthesia: General | Site: Abdomen

## 2024-10-23 MED ORDER — KETOROLAC TROMETHAMINE 30 MG/ML IJ SOLN
INTRAMUSCULAR | Status: AC
  Filled 2024-10-23: qty 1

## 2024-10-23 MED ORDER — MIDAZOLAM HCL (PF) 2 MG/2ML IJ SOLN
INTRAMUSCULAR | Status: DC | PRN
  Administered 2024-10-23: 2 mg via INTRAVENOUS

## 2024-10-23 MED ORDER — ACETAMINOPHEN 10 MG/ML IV SOLN
INTRAVENOUS | Status: AC
  Filled 2024-10-23: qty 100

## 2024-10-23 MED ORDER — SODIUM CHLORIDE 0.9 % IV BOLUS (SEPSIS)
1000.0000 mL | Freq: Once | INTRAVENOUS | Status: AC
  Administered 2024-10-23: 1000 mL via INTRAVENOUS

## 2024-10-23 MED ORDER — POLYETHYLENE GLYCOL 3350 17 G PO PACK
17.0000 g | PACK | Freq: Every day | ORAL | Status: DC | PRN
  Administered 2024-10-24: 17 g via ORAL
  Filled 2024-10-23: qty 1

## 2024-10-23 MED ORDER — LIDOCAINE HCL (CARDIAC) PF 100 MG/5ML IV SOSY
PREFILLED_SYRINGE | INTRAVENOUS | Status: DC | PRN
  Administered 2024-10-23: 100 mg via INTRAVENOUS

## 2024-10-23 MED ORDER — ESMOLOL HCL 100 MG/10ML IV SOLN
INTRAVENOUS | Status: AC
  Filled 2024-10-23: qty 10

## 2024-10-23 MED ORDER — ONDANSETRON HCL 4 MG/2ML IJ SOLN
4.0000 mg | Freq: Four times a day (QID) | INTRAMUSCULAR | Status: DC | PRN
  Administered 2024-10-23: 4 mg via INTRAVENOUS

## 2024-10-23 MED ORDER — BUPIVACAINE-EPINEPHRINE (PF) 0.5% -1:200000 IJ SOLN
INTRAMUSCULAR | Status: DC | PRN
  Administered 2024-10-23: 30 mL

## 2024-10-23 MED ORDER — PANTOPRAZOLE SODIUM 40 MG IV SOLR
40.0000 mg | Freq: Every day | INTRAVENOUS | Status: DC
  Administered 2024-10-23 – 2024-10-24 (×2): 40 mg via INTRAVENOUS
  Filled 2024-10-23 (×2): qty 10

## 2024-10-23 MED ORDER — ACETAMINOPHEN 500 MG PO TABS: 1000.0000 mg | ORAL_TABLET | Freq: Four times a day (QID) | ORAL | Status: AC | PRN

## 2024-10-23 MED ORDER — INDOCYANINE GREEN 25 MG IJ SOLR
1.2500 mg | INTRAMUSCULAR | Status: AC
  Administered 2024-10-23: 1.25 mg via INTRAVENOUS

## 2024-10-23 MED ORDER — PROPOFOL 10 MG/ML IV BOLUS
INTRAVENOUS | Status: AC
  Filled 2024-10-23: qty 20

## 2024-10-23 MED ORDER — PHENYLEPHRINE 80 MCG/ML (10ML) SYRINGE FOR IV PUSH (FOR BLOOD PRESSURE SUPPORT)
PREFILLED_SYRINGE | INTRAVENOUS | Status: DC | PRN
  Administered 2024-10-23 (×2): 80 ug via INTRAVENOUS

## 2024-10-23 MED ORDER — KETOROLAC TROMETHAMINE 30 MG/ML IJ SOLN
30.0000 mg | Freq: Four times a day (QID) | INTRAMUSCULAR | Status: DC
  Administered 2024-10-24 – 2024-10-25 (×6): 30 mg via INTRAVENOUS
  Filled 2024-10-23 (×6): qty 1

## 2024-10-23 MED ORDER — GLYCOPYRROLATE 0.2 MG/ML IJ SOLN
INTRAMUSCULAR | Status: DC | PRN
  Administered 2024-10-23: .2 mg via INTRAVENOUS

## 2024-10-23 MED ORDER — LACTATED RINGERS IV SOLN: INTRAVENOUS | Status: DC

## 2024-10-23 MED ORDER — FENTANYL CITRATE (PF) 100 MCG/2ML IJ SOLN
INTRAMUSCULAR | Status: AC
  Filled 2024-10-23: qty 2

## 2024-10-23 MED ORDER — HYDROMORPHONE HCL 1 MG/ML IJ SOLN
1.0000 mg | Freq: Once | INTRAMUSCULAR | Status: AC
  Administered 2024-10-23: 1 mg via INTRAVENOUS
  Filled 2024-10-23: qty 1

## 2024-10-23 MED ORDER — ONDANSETRON 4 MG PO TBDP: 4.0000 mg | ORAL_TABLET | Freq: Four times a day (QID) | ORAL | Status: DC | PRN

## 2024-10-23 MED ORDER — SUCCINYLCHOLINE CHLORIDE 200 MG/10ML IV SOSY
PREFILLED_SYRINGE | INTRAVENOUS | Status: DC | PRN
  Administered 2024-10-23: 200 mg via INTRAVENOUS

## 2024-10-23 MED ORDER — ONDANSETRON HCL 4 MG/2ML IJ SOLN
4.0000 mg | Freq: Once | INTRAMUSCULAR | Status: DC
  Filled 2024-10-23: qty 2

## 2024-10-23 MED ORDER — SUGAMMADEX SODIUM 200 MG/2ML IV SOLN
INTRAVENOUS | Status: DC | PRN
  Administered 2024-10-23: 250 mg via INTRAVENOUS

## 2024-10-23 MED ORDER — ONDANSETRON 4 MG PO TBDP
4.0000 mg | ORAL_TABLET | Freq: Once | ORAL | Status: AC | PRN
  Administered 2024-10-23: 4 mg via ORAL
  Filled 2024-10-23: qty 1

## 2024-10-23 MED ORDER — INDOCYANINE GREEN 25 MG IJ SOLR: 1.2500 mg | INTRAMUSCULAR | Status: DC

## 2024-10-23 MED ORDER — MIDAZOLAM HCL 2 MG/2ML IJ SOLN
INTRAMUSCULAR | Status: AC
  Filled 2024-10-23: qty 2

## 2024-10-23 MED ORDER — HYDROMORPHONE HCL 1 MG/ML IJ SOLN
0.5000 mg | INTRAMUSCULAR | Status: DC | PRN
  Administered 2024-10-23 (×2): 0.5 mg via INTRAVENOUS
  Filled 2024-10-23 (×2): qty 0.5

## 2024-10-23 MED ORDER — IBUPROFEN 600 MG PO TABS: 600.0000 mg | ORAL_TABLET | Freq: Three times a day (TID) | ORAL | 1 refills | Status: AC | PRN

## 2024-10-23 MED ORDER — PIPERACILLIN-TAZOBACTAM 3.375 G IVPB
3.3750 g | Freq: Three times a day (TID) | INTRAVENOUS | Status: DC
  Administered 2024-10-23 – 2024-10-25 (×7): 3.375 g via INTRAVENOUS
  Filled 2024-10-23 (×8): qty 50

## 2024-10-23 MED ORDER — AMOXICILLIN-POT CLAVULANATE 875-125 MG PO TABS: 1.0000 | ORAL_TABLET | Freq: Two times a day (BID) | ORAL | 0 refills | Status: AC

## 2024-10-23 MED ORDER — ONDANSETRON HCL 4 MG/2ML IJ SOLN
INTRAMUSCULAR | Status: AC
  Filled 2024-10-23: qty 2

## 2024-10-23 MED ORDER — PIPERACILLIN-TAZOBACTAM 3.375 G IVPB 30 MIN
3.3750 g | Freq: Once | INTRAVENOUS | Status: DC
  Filled 2024-10-23: qty 50

## 2024-10-23 MED ORDER — SEVOFLURANE IN SOLN
RESPIRATORY_TRACT | Status: AC
  Filled 2024-10-23: qty 250

## 2024-10-23 MED ORDER — PROPOFOL 10 MG/ML IV BOLUS
INTRAVENOUS | Status: DC | PRN
  Administered 2024-10-23: 200 mg via INTRAVENOUS

## 2024-10-23 MED ORDER — FENTANYL CITRATE (PF) 100 MCG/2ML IJ SOLN
INTRAMUSCULAR | Status: DC | PRN
  Administered 2024-10-23 (×2): 50 ug via INTRAVENOUS
  Administered 2024-10-23: 100 ug via INTRAVENOUS

## 2024-10-23 MED ORDER — EPHEDRINE SULFATE-NACL 50-0.9 MG/10ML-% IV SOSY
PREFILLED_SYRINGE | INTRAVENOUS | Status: DC | PRN
  Administered 2024-10-23: 5 mg via INTRAVENOUS

## 2024-10-23 MED ORDER — PHENYLEPHRINE 80 MCG/ML (10ML) SYRINGE FOR IV PUSH (FOR BLOOD PRESSURE SUPPORT)
PREFILLED_SYRINGE | INTRAVENOUS | Status: AC
  Filled 2024-10-23: qty 10

## 2024-10-23 MED ORDER — OXYCODONE HCL 5 MG PO TABS: 5.0000 mg | ORAL_TABLET | ORAL | 0 refills | Status: DC | PRN

## 2024-10-23 MED ORDER — DEXAMETHASONE SOD PHOSPHATE PF 10 MG/ML IJ SOLN
INTRAMUSCULAR | Status: DC | PRN
  Administered 2024-10-23: 8 mg via INTRAVENOUS

## 2024-10-23 MED ORDER — CEFAZOLIN SODIUM 1 G IJ SOLR
INTRAMUSCULAR | Status: AC
  Filled 2024-10-23: qty 20

## 2024-10-23 MED ORDER — 0.9 % SODIUM CHLORIDE (POUR BTL) OPTIME
TOPICAL | Status: DC | PRN
  Administered 2024-10-23: 1000 mL

## 2024-10-23 MED ORDER — FENTANYL CITRATE (PF) 100 MCG/2ML IJ SOLN: 25.0000 ug | INTRAMUSCULAR | Status: DC | PRN

## 2024-10-23 MED ORDER — OXYCODONE HCL 5 MG PO TABS
5.0000 mg | ORAL_TABLET | ORAL | Status: DC | PRN
  Administered 2024-10-23 (×2): 5 mg via ORAL
  Administered 2024-10-24 – 2024-10-25 (×5): 10 mg via ORAL
  Filled 2024-10-23: qty 2
  Filled 2024-10-23: qty 1
  Filled 2024-10-23 (×5): qty 2

## 2024-10-23 MED ORDER — ONDANSETRON HCL 4 MG/2ML IJ SOLN
INTRAMUSCULAR | Status: DC | PRN
  Administered 2024-10-23: 4 mg via INTRAVENOUS

## 2024-10-23 MED ORDER — SODIUM CHLORIDE 0.9 % IV SOLN: INTRAVENOUS | Status: DC

## 2024-10-23 MED ORDER — ACETAMINOPHEN 500 MG PO TABS
1000.0000 mg | ORAL_TABLET | Freq: Four times a day (QID) | ORAL | Status: DC
  Administered 2024-10-23 – 2024-10-25 (×7): 1000 mg via ORAL
  Filled 2024-10-23 (×8): qty 2

## 2024-10-23 MED ORDER — KETOROLAC TROMETHAMINE 30 MG/ML IJ SOLN
INTRAMUSCULAR | Status: DC | PRN
  Administered 2024-10-23: 30 mg via INTRAVENOUS

## 2024-10-23 MED ORDER — ROCURONIUM BROMIDE 100 MG/10ML IV SOLN
INTRAVENOUS | Status: DC | PRN
  Administered 2024-10-23: 50 mg via INTRAVENOUS
  Administered 2024-10-23: 10 mg via INTRAVENOUS

## 2024-10-23 MED ORDER — CEFAZOLIN SODIUM-DEXTROSE 2-3 GM-%(50ML) IV SOLR
INTRAVENOUS | Status: DC | PRN
  Administered 2024-10-23: 2 g via INTRAVENOUS

## 2024-10-23 MED ORDER — OXYCODONE HCL 5 MG/5ML PO SOLN: 5.0000 mg | Freq: Once | ORAL | Status: DC | PRN

## 2024-10-23 MED ORDER — OXYCODONE HCL 5 MG PO TABS: 5.0000 mg | ORAL_TABLET | Freq: Once | ORAL | Status: DC | PRN

## 2024-10-23 MED ORDER — ESMOLOL HCL 100 MG/10ML IV SOLN
INTRAVENOUS | Status: DC | PRN
  Administered 2024-10-23: 5 mg via INTRAVENOUS

## 2024-10-23 MED ORDER — BUPIVACAINE-EPINEPHRINE (PF) 0.5% -1:200000 IJ SOLN
INTRAMUSCULAR | Status: AC
  Filled 2024-10-23: qty 30

## 2024-10-23 SURGICAL SUPPLY — 42 items
BAG PRESSURE INF REUSE 1000 (BAG) IMPLANT
CANNULA CAP OBTURATR AIRSEAL 8 (CAP) IMPLANT
CAUTERY HOOK MNPLR 1.6 DVNC XI (INSTRUMENTS) ×1 IMPLANT
CLIP LIGATING HEMO O LOK GREEN (MISCELLANEOUS) ×1 IMPLANT
DEFOGGER SCOPE WARM SEASHARP (MISCELLANEOUS) ×1 IMPLANT
DERMABOND ADVANCED .7 DNX12 (GAUZE/BANDAGES/DRESSINGS) ×1 IMPLANT
DRAIN CHANNEL JP 19F RND 3/16 (MISCELLANEOUS) IMPLANT
DRAPE ARM DVNC X/XI (DISPOSABLE) ×4 IMPLANT
DRAPE COLUMN DVNC XI (DISPOSABLE) ×1 IMPLANT
DRSG TEGADERM 4X10 (GAUZE/BANDAGES/DRESSINGS) IMPLANT
DRSG TEGADERM 4X4.75 (GAUZE/BANDAGES/DRESSINGS) IMPLANT
ELECTRODE CAUTERY BLDE TIP 2.5 (TIP) ×1 IMPLANT
ELECTRODE REM PT RTRN 9FT ADLT (ELECTROSURGICAL) ×1 IMPLANT
EVACUATOR SILICONE 100CC (DRAIN) IMPLANT
FORCEPS BPLR R/ABLATION 8 DVNC (INSTRUMENTS) ×1 IMPLANT
FORCEPS PROGRASP DVNC XI (FORCEP) ×1 IMPLANT
GLOVE SURG SYN 7.0 PF PI (GLOVE) ×2 IMPLANT
GLOVE SURG SYN 7.5 PF PI (GLOVE) ×2 IMPLANT
GOWN STRL REUS W/ TWL LRG LVL3 (GOWN DISPOSABLE) ×4 IMPLANT
IRRIGATOR SUCT 8 DISP DVNC XI (IRRIGATION / IRRIGATOR) IMPLANT
IV 0.9% NACL 1000 ML (IV SOLUTION) IMPLANT
KIT PINK PAD W/HEAD ARM REST (MISCELLANEOUS) ×1 IMPLANT
LABEL OR SOLS (LABEL) ×1 IMPLANT
MANIFOLD NEPTUNE II (INSTRUMENTS) ×1 IMPLANT
NDL HYPO 22X1.5 SAFETY MO (MISCELLANEOUS) ×1 IMPLANT
NEEDLE HYPO 22X1.5 SAFETY MO (MISCELLANEOUS) ×1 IMPLANT
NS IRRIG 500ML POUR BTL (IV SOLUTION) ×1 IMPLANT
OBTURATOR OPTICALSTD 8 DVNC (TROCAR) ×1 IMPLANT
PACK LAP CHOLECYSTECTOMY (MISCELLANEOUS) ×1 IMPLANT
SEAL UNIV 5-12 XI (MISCELLANEOUS) ×4 IMPLANT
SET TUBE FILTERED XL AIRSEAL (SET/KITS/TRAYS/PACK) IMPLANT
SET TUBE SMOKE EVAC HIGH FLOW (TUBING) ×1 IMPLANT
SOLN STERILE WATER 500 ML (IV SOLUTION) ×1 IMPLANT
SOLUTION ELECTROSURG ANTI STCK (MISCELLANEOUS) ×1 IMPLANT
SPIKE FLUID TRANSFER (MISCELLANEOUS) ×1 IMPLANT
SPONGE VERSALON 4X4 4PLY (MISCELLANEOUS) IMPLANT
SUT ETHILON 2 0 FS 18 (SUTURE) IMPLANT
SUT MNCRL AB 4-0 PS2 18 (SUTURE) ×1 IMPLANT
SUT VIC AB 3-0 SH 27X BRD (SUTURE) IMPLANT
SUT VICRYL 0 UR6 27IN ABS (SUTURE) ×2 IMPLANT
SYR 50ML LL SCALE MARK (SYRINGE) IMPLANT
SYSTEM BAG RETRIEVAL 10MM (BASKET) ×1 IMPLANT

## 2024-10-23 NOTE — ED Provider Notes (Signed)
 "  [facility] Provider Note    Event Date/Time   First MD Initiated Contact with Patient 10/23/24 [phone]     (approximate)   History   Abdominal Pain   HPI  Jerry Collins is a 28 y.o. male with history of ADD, diverticulitis not on medication who presents to the emergency department complaints of right upper quad abdominal pain, chills, nausea and vomiting.  Had symptoms over the weekend when he was out of town in [place] but symptoms improved and he did not go to the hospital.  States symptoms worsened tonight and he is unable to stop vomiting.  Symptoms worse after eating.  No abdominal surgery.  No urinary symptoms.   History provided by patient, mother.    Past Medical History:  Diagnosis Date   ADD (attention deficit disorder)    Diverticulitis     No past surgical history on file.  MEDICATIONS:  Prior to Admission medications  Medication Sig Start Date End Date Taking? Authorizing Provider  ciprofloxacin  (CIPRO ) 500 MG tablet Take 1 tablet (500 mg total) by mouth 2 (two) times daily. 08/14/20   Triplett, Cari B, FNP  dexmethylphenidate (FOCALIN) 10 MG tablet Take 10 mg by mouth 2 (two) times daily.    [provider]  polyethylene glycol (MIRALAX ) packet Take 17 g by mouth daily. 10/13/18   Arlander Charleston, MD  terbinafine  (LAMISIL ) 250 MG tablet Take 1 tablet (250 mg total) by mouth daily. 06/11/14   Magdalen Pasco RAMAN, DPM  traMADol  (ULTRAM ) 50 MG tablet Take 1 tablet (50 mg total) by mouth every 6 (six) hours as needed. 08/14/20   Herlinda Kirk NOVAK, FNP    Physical Exam   Triage Vital Signs: ED Triage Vitals  Encounter Vitals Group     BP 10/23/24 0257 (!) 154/106     Girls Systolic BP Percentile --      Girls Diastolic BP Percentile --      Boys Systolic BP Percentile --      Boys Diastolic BP Percentile --      Pulse Rate 10/23/24 0257 62     Resp 10/23/24 0257 (!) 24     Temp 10/23/24 0257 97.7 F (36.5 C)     Temp Source  10/23/24 0257 Oral     SpO2 10/23/24 0257 93 %     Weight 10/23/24 0258 280 lb (127 kg)     Height 10/23/24 0258 6' (1.829 m)     Head Circumference --      Peak Flow --      Pain Score 10/23/24 0257 8     Pain Loc --      Pain Education --      Exclude from Growth Chart --     Most recent vital signs: Vitals:   10/23/24 0257  BP: (!) 154/106  Pulse: 62  Resp: (!) 24  Temp: 97.7 F (36.5 C)  SpO2: 93%    CONSTITUTIONAL: Alert, responds appropriately to questions.  Appears uncomfortable, actively vomiting HEAD: Normocephalic, atraumatic EYES: Conjunctivae clear, pupils appear equal, sclera nonicteric ENT: normal nose; moist mucous membranes NECK: Supple, normal ROM CARD: RRR; S1 and S2 appreciated RESP: Normal chest excursion without splinting or tachypnea; breath sounds clear and equal bilaterally; no wheezes, no rhonchi, no rales, no hypoxia or respiratory distress, speaking full sentences ABD/GI: Non-distended; soft, tender in the right upper quadrant with positive Murphy sign, no guarding or rebound, no tenderness at McBurney's point BACK: The back appears  normal EXT: Normal ROM in all joints; no deformity noted, no edema SKIN: Normal color for age and race; warm; no rash on exposed skin NEURO: Moves all extremities equally, normal speech PSYCH: The patient's mood and manner are appropriate.   ED Results / Procedures / Treatments   LABS: (all labs ordered are listed, but only abnormal results are displayed) Labs Reviewed  LIPASE, BLOOD  COMPREHENSIVE METABOLIC PANEL WITH GFR  CBC  URINALYSIS, ROUTINE W REFLEX MICROSCOPIC  HIV ANTIBODY (ROUTINE TESTING W REFLEX)     EKG:   RADIOLOGY: My personal review and interpretation of imaging: Gallstones.  I have personally reviewed all radiology reports.   US  Abdomen Limited RUQ (LIVER/GB) Result Date: 10/23/2024 CLINICAL DATA:  Initial evaluation for acute abdominal pain. EXAM: ULTRASOUND ABDOMEN LIMITED RIGHT  UPPER QUADRANT COMPARISON:  CT from 08/14/2020 FINDINGS: Gallbladder: Gallbladder is distended with stones and sludge within the gallbladder lumen. Largest visible discrete stone measures 6 mm. Gallbladder wall within normal limits measuring 2.5 mm. No free pericholecystic fluid. A positive sonographic Murphy sign was elicited on exam. Common bile duct: Diameter: 2.5 mm Liver: No focal lesion identified. Increased echogenicity within the hepatic parenchyma. Portal vein is patent on color Doppler imaging with normal direction of blood flow towards the liver. Other: None. IMPRESSION: 1. Distended gallbladder with internal stones and sludge with positive sonographic Murphy sign. Findings raise the possibility for acute cholecystitis. Correlation with physical exam and laboratory values recommended. 2. No biliary dilatation. 3. Increased echogenicity within the hepatic parenchyma, nonspecific, but most commonly seen with steatosis. Electronically Signed   By: Morene Hoard M.D.   On: 10/23/2024 04:53     PROCEDURES:  Critical Care performed: No    Procedures    IMPRESSION / MDM / ASSESSMENT AND PLAN / ED COURSE  I reviewed the triage vital signs and the nursing notes.    Patient here with complaints of right upper quad abdominal pain, vomiting.     DIFFERENTIAL DIAGNOSIS (includes but not limited to):   Gallstones, cholecystitis, choledocholithiasis, pancreatitis, GERD, gastritis, doubt appendicitis, perforation   Patient's presentation is most consistent with acute presentation with potential threat to life or bodily function.   PLAN: Labs show no leukocytosis.  Normal LFTs and lipase.  Right upper quad ultrasound reviewed and interpreted by myself and the radiologist and shows gallstones, sludge but no pericholecystic fluid or wall thickening but he does have a positive Murphy sign with the ultrasonographer as well as with myself on my exam.  I suspect clinically he either has very  severe symptomatic cholelithiasis versus early acute cholecystitis.  Will give IV fluids, pain and nausea medicine, IV antibiotics.  Will keep him n.p.o. and discussed with surgery for admission.   MEDICATIONS GIVEN IN ED: Medications  sodium chloride  0.9 % bolus 1,000 mL (1,000 mLs Intravenous New Bag/Given 10/23/24 0543)  0.9 %  sodium chloride  infusion (has no administration in time range)  lactated ringers  infusion (has no administration in time range)  acetaminophen  (TYLENOL ) tablet 1,000 mg (1,000 mg Oral Given 10/23/24 0552)  oxyCODONE  (Oxy IR/ROXICODONE ) immediate release tablet 5-10 mg (has no administration in time range)  HYDROmorphone  (DILAUDID ) injection 0.5 mg (has no administration in time range)  polyethylene glycol (MIRALAX  / GLYCOLAX ) packet 17 g (has no administration in time range)  ondansetron  (ZOFRAN -ODT) disintegrating tablet 4 mg ( Oral See Alternative 10/23/24 0544)    Or  ondansetron  (ZOFRAN ) injection 4 mg (4 mg Intravenous Given 10/23/24 0544)  pantoprazole  (PROTONIX ) injection 40  mg (has no administration in time range)  piperacillin -tazobactam (ZOSYN ) IVPB 3.375 g (3.375 g Intravenous New Bag/Given 10/23/24 0557)  ondansetron  (ZOFRAN -ODT) disintegrating tablet 4 mg (4 mg Oral Given 10/23/24 0304)  HYDROmorphone  (DILAUDID ) injection 1 mg (1 mg Intravenous Given 10/23/24 0544)     ED COURSE: Discussed with Dr. Desiderio with general surgery for admission.  Appreciate his help.  He will place admission orders.     OUTSIDE RECORDS REVIEWED: Reviewed last internal medicine note on 06/26/2024.       FINAL CLINICAL IMPRESSION(S) / ED DIAGNOSES   Final diagnoses:  Acute cholecystitis     Rx / DC Orders   ED Discharge Orders     None        Note:  This document was prepared using Dragon voice recognition software and may include unintentional dictation errors.   Quanetta Truss, Josette SAILOR, DO 10/23/24 [phone]  "

## 2024-10-23 NOTE — ED Triage Notes (Signed)
 Had abdominal pain since 12/14. Patient took gas ex and subsided till today. Upper abd pain on right side.Last Bowel movement 12/18.

## 2024-10-23 NOTE — Anesthesia Procedure Notes (Signed)
 Procedure Name: Intubation Date/Time: 10/23/2024 4:43 PM  Performed by: Trashawn Oquendo D, CRNAPre-anesthesia Checklist: Patient identified, Patient being monitored, Timeout performed, Emergency Drugs available and Suction available Patient Re-evaluated:Patient Re-evaluated prior to induction Oxygen Delivery Method: Circle system utilized Preoxygenation: Pre-oxygenation with 100% oxygen Induction Type: IV induction Ventilation: Mask ventilation without difficulty Laryngoscope Size: Mac and 3 Grade View: Grade I Tube type: Oral Tube size: 7.5 mm Number of attempts: 1 Airway Equipment and Method: Stylet and Video-laryngoscopy Placement Confirmation: ETT inserted through vocal cords under direct vision, positive ETCO2 and breath sounds checked- equal and bilateral Secured at: 21 cm Tube secured with: Tape Dental Injury: Teeth and Oropharynx as per pre-operative assessment

## 2024-10-23 NOTE — H&P (Signed)
  [facility] SURGICAL HISTORY & PHYSICAL (cpt [phone])  HISTORY OF PRESENT ILLNESS (HPI):  28 y.o. male presented to [facility] ED overnight for abdominal pain. Patient reports he has been having RUQ pain for about 5 days now. This started over the weekend but initially improved. Unfortunately, pain has since returned. This has been accompanied with chills, nausea, and emesis. He denied any measured fever, CP, SOB, urinary changes, or bowel changes. Prior to this week, he denied any similar. No previous abdominal surgeries. Work up in the ED revealed a normal WBC 9.4K, Hgb to 16.3, sCr 1.16, bilirubin to 0.9. RUQ US  was obtained noting distension with stones and findings concerning for acute cholecystitis.   General surgery is consulted by emergency medicine physician Dr Josette Sink, DO for evaluation and management of acute cholecystitis  PAST MEDICAL HISTORY (PMH):  Past Medical History:  Diagnosis Date   ADD (attention deficit disorder)    Diverticulitis     Reviewed. Otherwise negative.   PAST SURGICAL HISTORY (PSH):  No past surgical history on file.  Reviewed. Otherwise negative.   MEDICATIONS:  Prior to Admission medications  Medication Sig Start Date End Date Taking? Authorizing Provider  amphetamine-dextroamphetamine (ADDERALL) 20 MG tablet Take 20 mg by mouth daily. 08/26/24  Yes [provider]  lisdexamfetamine (VYVANSE) 70 MG capsule Take 70 mg by mouth daily. 09/30/24  Yes [provider]  omeprazole (PRILOSEC) 40 MG capsule Take 40 mg by mouth daily as needed. 05/22/24  Yes [provider]  ciprofloxacin  (CIPRO ) 500 MG tablet Take 1 tablet (500 mg total) by mouth 2 (two) times daily. Patient not taking: Reported on 10/23/2024 08/14/20   Herlinda Belton B, FNP  dexmethylphenidate (FOCALIN) 10 MG tablet Take 10 mg by mouth 2 (two) times daily. Patient not taking: Reported on 10/23/2024    [provider]  polyethylene glycol  (MIRALAX ) packet Take 17 g by mouth daily. Patient not taking: Reported on 10/23/2024 10/13/18   Arlander Charleston, MD  terbinafine  (LAMISIL ) 250 MG tablet Take 1 tablet (250 mg total) by mouth daily. Patient not taking: Reported on 10/23/2024 06/11/14   Magdalen Pasco RAMAN, DPM  traMADol  (ULTRAM ) 50 MG tablet Take 1 tablet (50 mg total) by mouth every 6 (six) hours as needed. Patient not taking: Reported on 10/23/2024 08/14/20   Herlinda Belton NOVAK, FNP     ALLERGIES:  Allergies[1]   SOCIAL HISTORY:  Social History   Socioeconomic History   Marital status: Single    Spouse name: Not on file   Number of children: Not on file   Years of education: Not on file   Highest education level: Not on file  Occupational History   Not on file  Tobacco Use   Smoking status: Never   Smokeless tobacco: Never  Substance and Sexual Activity   Alcohol use: Not on file   Drug use: Not on file   Sexual activity: Not on file  Other Topics Concern   Not on file  Social History Narrative   Not on file   Social Drivers of Health   Tobacco Use: Low Risk (10/23/2024)   Patient History    Smoking Tobacco Use: Never    Smokeless Tobacco Use: Never    Passive Exposure: Not on file  Financial Resource Strain: Low Risk  (06/23/2024)   Received from [facility]   Overall Financial Resource Strain (CARDIA)    Difficulty of Paying Living Expenses: Not hard at all  Food Insecurity: No  Food Insecurity (06/23/2024)   Received from [facility]   Epic    Within the past 12 months, you worried that your food would run out before you got the money to buy more.: Never true    Within the past 12 months, the food you bought just didn't last and you didn't have money to get more.: Never true  Transportation Needs: No Transportation Needs (06/23/2024)   Received from [facility] - Transportation    In the past 12 months, has lack of transportation kept you from  medical appointments or from getting medications?: No    Lack of Transportation (Non-Medical): No  Physical Activity: Not on file  Stress: Not on file  Social Connections: Not on file  Intimate Partner Violence: Not on file  Depression (EYV7-0): Not on file  Alcohol Screen: Not on file  Housing: Low Risk  (06/23/2024)   Received from [facility]   Epic    In the last 12 months, was there a time when you were not able to pay the mortgage or rent on time?: No    In the past 12 months, how many times have you moved where you were living?: 0    At any time in the past 12 months, were you homeless or living in a shelter (including now)?: No  Utilities: Not At Risk (06/23/2024)   Received from [facility]   Epic    In the past 12 months has the electric, gas, oil, or water company threatened to shut off services in your home?: No  Health Literacy: Not on file     FAMILY HISTORY:  No family history on file.  Otherwise negative.   REVIEW OF SYSTEMS:  Review of Systems  Constitutional:  Negative for chills and fever.  Respiratory:  Negative for cough and shortness of breath.   Cardiovascular:  Negative for chest pain and palpitations.  Gastrointestinal:  Positive for abdominal pain, nausea and vomiting. Negative for constipation and diarrhea.  Genitourinary:  Negative for dysuria and urgency.  All other systems reviewed and are negative.   VITAL SIGNS:  Temp:  [97.7 F (36.5 C)-98.3 F (36.8 C)] 98.3 F (36.8 C) (12/19 0815) Pulse Rate:  [62] 62 (12/19 0815) Resp:  [18-24] 18 (12/19 0815) BP: (143-154)/(100-106) 143/100 (12/19 0815) SpO2:  [93 %-99 %] 99 % (12/19 0815) Weight:  [872 kg] 127 kg (12/19 0258)     Height: 6' (182.9 cm) Weight: 127 kg BMI (Calculated): 37.97   PHYSICAL EXAM:  Physical Exam Vitals and nursing note reviewed. Exam conducted with a chaperone present.  Constitutional:      General: He is not in acute distress.     Appearance: He is well-developed and normal weight. He is not ill-appearing.  HENT:     Head: Normocephalic and atraumatic.  Eyes:     General: No scleral icterus.    Extraocular Movements: Extraocular movements intact.  Cardiovascular:     Rate and Rhythm: Normal rate.     Heart sounds: Normal heart sounds. No murmur heard. Pulmonary:     Effort: Pulmonary effort is normal. No respiratory distress.  Abdominal:     General: Abdomen is flat. There is no distension.     Palpations: Abdomen is soft.     Tenderness: There is abdominal tenderness in the right upper quadrant. There is no guarding or rebound.  Genitourinary:    Comments: Deferred Skin:  General: Skin is warm and dry.     Coloration: Skin is not jaundiced.  Neurological:     General: No focal deficit present.     Mental Status: He is alert and oriented to person, place, and time.  Psychiatric:        Mood and Affect: Mood normal.        Behavior: Behavior normal.     INTAKE/OUTPUT:  This shift: No intake/output data recorded.  Last 2 shifts: @IOLAST2SHIFTS @  Labs:     Latest Ref Rng & Units 10/23/2024    2:59 AM 08/13/2020   11:56 PM 10/13/2018    7:14 PM  CBC  WBC 4.0 - 10.5 K/uL 9.4  9.7  14.3   Hemoglobin 13.0 - 17.0 g/dL 83.6  84.9  82.6   Hematocrit 39.0 - 52.0 % 47.3  41.1  49.9   Platelets 150 - 400 K/uL 246  184  282       Latest Ref Rng & Units 10/23/2024    2:59 AM 08/13/2020   11:56 PM 10/13/2018    7:14 PM  CMP  Glucose 70 - 99 mg/dL 98  94  94   BUN 6 - 20 mg/dL 17  13  17    Creatinine 0.61 - 1.24 mg/dL 8.83  8.94  8.86   Sodium 135 - 145 mmol/L 142  139  137   Potassium 3.5 - 5.1 mmol/L 4.0  3.3  3.6   Chloride 98 - 111 mmol/L 101  106  101   CO2 22 - 32 mmol/L 27  25  27    Calcium 8.9 - 10.3 mg/dL 9.7  8.7  9.4   Total Protein 6.5 - 8.1 g/dL 7.7  7.8  8.2   Total Bilirubin 0.0 - 1.2 mg/dL 0.9  1.7  1.2   Alkaline Phos 38 - 126 U/L 67  56  55   AST 15 - 41 U/L 19  17  68   ALT 0 - 44  U/L 29  30  108      Imaging studies:   RUQ US  (10/23/2024) personally reviewed with distension of the gallbladder with stones/sludge, concerning for acute cholecystitis, and radiologist report reviewed below: IMPRESSION: 1. Distended gallbladder with internal stones and sludge with positive sonographic Murphy sign. Findings raise the possibility for acute cholecystitis. Correlation with physical exam and laboratory values recommended. 2. No biliary dilatation. 3. Increased echogenicity within the hepatic parenchyma, nonspecific, but most commonly seen with steatosis.    Assessment/Plan: (ICD-10's: K81.0) 28 y.o. male with acute cholecystitis   - Admit to general surgery    - Plan for robotic assisted laparoscopic cholecystectomy this afternoon pending OR/Anesthesia availability   - All risks, benefits, and alternatives to above procedure(s) were discussed with the patient, all of his questions were answered to his expressed satisfaction, patient expresses he wishes to proceed, and informed consent was obtained.   - NPO for planned procedure; IVF  - IV Abx (Zosyn)  - ICG on call to OR  - Monitor abdominal examination; on-going bowel function - Pain control prn; antiemetics prn   All of the above findings and recommendations were discussed with the patient and his family, and all of their questions were answered to their expressed satisfaction.  -- Arthea Platt, PA-C [facility] 10/23/2024, 8:40 AM M-F: 7am - 4pm     [1] No Known Allergies

## 2024-10-23 NOTE — TOC CM/SW Note (Signed)
 Transition of Care [facility]) - Inpatient Brief Assessment   Patient Details  Name: AZHAR KNOPE MRN: [id number] Date of Birth: 20-Jul-1996  Transition of [facility] CM/SW Contact:    Daved JONETTA Hamilton, RN Phone Number: 10/23/2024, 12:01 PM   Clinical Narrative:   Transition of Care (TOC) Screening Note   Patient Details  Name: INDALECIO MALMSTROM Date of Birth: [birth year]   Transition of Care [facility]) CM/SW Contact:    Daved JONETTA Hamilton, RN Phone Number: 10/23/2024, 12:01 PM    Transition of Care Department [facility]) has reviewed patient and no TOC needs have been identified at this time. If new patient transition needs arise, please place a TOC consult.    Transition of Care Asessment: Insurance and Status: Insurance coverage has been reviewed Patient has primary care physician: Yes   Prior level of function:: Independent Prior/Current Home Services: No current home services Social Drivers of Health Review: SDOH reviewed no interventions necessary Readmission risk has been reviewed: No (Patient is in observation status, no score generated) Transition of care needs: no transition of care needs at this time

## 2024-10-23 NOTE — Discharge Instructions (Signed)
 Discharge Instructions: 1.  Patient may shower, but do not scrub wounds heavily and dab dry only. 2.  Do not submerge wounds in pool/tub until fully healed. 3.  Do not apply ointments or hydrogen peroxide to the wounds. 4.  May apply ice packs to the wounds for comfort. 5.  Please empty/recharge and record drain output twice daily and as needed to keep the bulb only half full at the most.  6.  Change drain dressing with 4x4 gauze and tape daily. 7.  Do not drive while taking narcotics for pain control.  Prior to driving, make sure you are able to rotate right and left to look at blindspots without significant pain or discomfort. 8.  No heavy lifting or pushing of more than 10-15 lbs for 4 weeks.

## 2024-10-23 NOTE — ED Notes (Signed)
 Informed RN bed assigned

## 2024-10-23 NOTE — Transfer of Care (Signed)
 Immediate Anesthesia Transfer of Care Note  Patient: Jerry Collins  Procedure(s) Performed: CHOLECYSTECTOMY, ROBOT-ASSISTED, LAPAROSCOPIC (Abdomen)  Patient Location: PACU  Anesthesia Type:General  Level of Consciousness: drowsy  Airway & Oxygen Therapy: Patient Spontanous Breathing and Patient connected to face mask oxygen  Post-op Assessment: Report given to RN and Post -op Vital signs reviewed and stable  Post vital signs: Reviewed and stable  Last Vitals:  Vitals Value Taken Time  BP 113/64 10/23/24 19:25  Temp    Pulse 96 10/23/24 19:28  Resp 29 10/23/24 19:28  SpO2 96 % 10/23/24 19:28  Vitals shown include unfiled device data.  Last Pain:  Vitals:   10/23/24 1435  TempSrc: Temporal  PainSc: 3          Complications: No notable events documented.

## 2024-10-23 NOTE — Op Note (Signed)
" °  Procedure Date:  10/23/2024  Pre-operative Diagnosis:  Acute cholecystitis  Post-operative Diagnosis: Acute cholecystitis  Procedure:  Robotic assisted cholecystectomy with ICG FireFly cholangiogram  Surgeon:  Aloysius Sheree Plant, MD  Anesthesia:  General endotracheal  Estimated Blood Loss:  30 ml  Specimens:  gallbladder  Complications:  None  Indications for Procedure:  This is a 28 y.o. male who presents with abdominal pain and workup revealing acute cholecystitis.  The benefits, complications, treatment options, and expected outcomes were discussed with the patient. The risks of bleeding, infection, recurrence of symptoms, failure to resolve symptoms, bile duct damage, bile duct leak, retained common bile duct stone, bowel injury, and need for further procedures were all discussed with the patient and he was willing to proceed.  Description of Procedure: The patient was correctly identified in the preoperative area and brought into the operating room.  The patient was placed supine with VTE prophylaxis in place.  Appropriate time-outs were performed.  Anesthesia was induced and the patient was intubated.  Appropriate antibiotics were infused.  The abdomen was prepped and draped in a sterile fashion. An infraumbilical incision was made. A cutdown technique was used to enter the abdominal cavity without injury, and a 12 mm robotic port was inserted.  Pneumoperitoneum was obtained with appropriate opening pressures.  Three 8-mm ports were placed in the mid abdomen at the level of the umbilicus under direct visualization.  The DaVinci platform was docked, camera targeted, and instruments were placed under direct visualization.  The gallbladder was identified.  It was very edematous, with omentum adhered.  The surrounding mesentery and omentum was very edematous as well.  The gallbladder required needle decompression.  After, the fundus was grasped and retracted cephalad.  Further adhesions  were lysed bluntly and with electrocautery. The infundibulum was grasped and retracted laterally, exposing the peritoneum overlying the gallbladder.  This was incised with electrocautery and extended on either side of the gallbladder.  FireFly cholangiogram was then obtained, and we were able to clearly identify the common bile duct but not the cystic duct, consistent with cholecystitis.  The area of the cystic duct and artery was very inflamed. The cystic duct and cystic artery were carefully dissected with combination of cautery and blunt dissection.  Both were clipped twice proximally and once distally, cutting in between.  The gallbladder was taken from the gallbladder fossa in a retrograde fashion with electrocautery. The gallbladder was placed in an Endocatch bag. The liver bed was inspected and any bleeding was controlled with electrocautery. The right upper quadrant was then inspected again revealing intact clips, no bleeding, and no ductal injury.  The area was thoroughly irrigated.  A 19 Fr. Blake drain was inserted via right lateral port into the gallbladder fossa and RUQ.  The 8 mm ports were removed under direct visualization and the 12 mm port was removed.  The Endocatch bag was brought out via the umbilical incision. The fascial opening was closed using 0 vicryl suture.  Local anesthetic was infused in all incisions and the incisions were closed with 4-0 Monocryl.  The wounds were cleaned and sealed with DermaBond.  The drain was secured with 2-0 Nylon and dressed with 4x4 gauze and TegaDerm.  The patient was emerged from anesthesia and extubated and brought to the recovery room for further management.  The patient tolerated the procedure well and all counts were correct at the end of the case.   Aloysius Sheree Plant, MD     "

## 2024-10-23 NOTE — Anesthesia Postprocedure Evaluation (Signed)
"   Anesthesia Post Note  Patient: Thomes Z Harmon  Procedure(s) Performed: CHOLECYSTECTOMY, ROBOT-ASSISTED, LAPAROSCOPIC (Abdomen)  Patient location during evaluation: PACU Anesthesia Type: General Level of consciousness: awake and alert Pain management: pain level controlled Vital Signs Assessment: post-procedure vital signs reviewed and stable Respiratory status: spontaneous breathing, nonlabored ventilation and respiratory function stable Cardiovascular status: blood pressure returned to baseline and stable Postop Assessment: no apparent nausea or vomiting Anesthetic complications: no   No notable events documented.   Last Vitals:  Vitals:   10/23/24 2000 10/23/24 2010  BP: 123/80   Pulse: 89 92  Resp: 19 20  Temp:  (!) 36.2 C  SpO2: 93% 96%    Last Pain:  Vitals:   10/23/24 2010  TempSrc:   PainSc: 0-No pain                 Fairy POUR Starling Jessie      "

## 2024-10-23 NOTE — Anesthesia Preprocedure Evaluation (Signed)
"                                    Anesthesia Evaluation  Patient identified by MRN, date of birth, ID band Patient awake    Reviewed: Allergy & Precautions, NPO status , Patient's Chart, lab work & pertinent test results  History of Anesthesia Complications Negative for: history of anesthetic complications  Airway Mallampati: III  TM Distance: >3 FB Neck ROM: full    Dental  (+) Chipped   Pulmonary neg pulmonary ROS, neg shortness of breath   Pulmonary exam normal        Cardiovascular Exercise Tolerance: Good negative cardio ROS Normal cardiovascular exam     Neuro/Psych negative neurological ROS  negative psych ROS   GI/Hepatic negative GI ROS, Neg liver ROS,,,  Endo/Other  negative endocrine ROS    Renal/GU      Musculoskeletal   Abdominal   Peds  Hematology negative hematology ROS (+)   Anesthesia Other Findings Past Medical History: No date: ADD (attention deficit disorder) No date: Diverticulitis  History reviewed. No pertinent surgical history.  BMI    Body Mass Index: 37.97 kg/m      Reproductive/Obstetrics negative OB ROS                              Anesthesia Physical Anesthesia Plan  ASA: 2  Anesthesia Plan: General ETT   Post-op Pain Management:    Induction: Intravenous  PONV Risk Score and Plan: Ondansetron , Dexamethasone , Midazolam  and Treatment may vary due to age or medical condition  Airway Management Planned: Oral ETT  Additional Equipment:   Intra-op Plan:   Post-operative Plan: Extubation in OR  Informed Consent: I have reviewed the patients History and Physical, chart, labs and discussed the procedure including the risks, benefits and alternatives for the proposed anesthesia with the patient or authorized representative who has indicated his/her understanding and acceptance.     Dental Advisory Given  Plan Discussed with: Anesthesiologist, CRNA and Surgeon  Anesthesia  Plan Comments: (Patient consented for risks of anesthesia including but not limited to:  - adverse reactions to medications - damage to eyes, teeth, lips or other oral mucosa - nerve damage due to positioning  - sore throat or hoarseness - Damage to heart, brain, nerves, lungs, other parts of body or loss of life  Patient voiced understanding and assent.)        Anesthesia Quick Evaluation  "

## 2024-10-23 NOTE — Interval H&P Note (Signed)
 History and Physical Interval Note:  10/23/2024 4:10 PM  Jerry Collins  has presented today for surgery, with the diagnosis of acute cholecystitis.  The various methods of treatment have been discussed with the patient and family. After consideration of risks, benefits and other options for treatment, the patient has consented to  Procedures with comments: CHOLECYSTECTOMY, ROBOT-ASSISTED, LAPAROSCOPIC (N/A) - Free after 12:30 PM. as a surgical intervention.  The patient's history has been reviewed, patient examined, no change in status, stable for surgery.  I have reviewed the patient's chart and labs.  Questions were answered to the patient's satisfaction.     [name]

## 2024-10-24 NOTE — Progress Notes (Signed)
 Subjective:  CC: Jerry Collins is a 28 y.o. male  Hospital stay day 0, 1 Day Post-Op robotic assisted laparoscopic cholecystectomy  HPI: No specific concerns overnight.  Tolerating full liquid diet  ROS:  Pertinent positives and negatives noted in the HPI.    Objective:   Temp:  [97.1 F (36.2 C)-98.6 F (37 C)] 97.6 F (36.4 C) (12/20 0740) Pulse Rate:  [68-102] 68 (12/20 0740) Resp:  [16-22] 19 (12/20 0740) BP: (113-149)/(64-99) 122/79 (12/20 0740) SpO2:  [93 %-98 %] 96 % (12/20 0740)     Height: 6' (182.9 cm) Weight: 127 kg BMI (Calculated): 37.97   Intake/Output this shift:   Intake/Output Summary (Last 24 hours) at 10/24/2024 1147 Last data filed at 10/24/2024 1122 Gross per 24 hour  Intake 2112.81 ml  Output 1210 ml  Net 902.81 ml    Constitutional :  alert, cooperative, appears stated age, and no distress  Respiratory:  Clear to auscultation bilaterally  Cardiovascular:  Regular rate and rhythm  Gastrointestinal: Soft, no guarding, tenderness to palpation along incision site and drain site with serosanguineous drainage.   Skin: Cool and moist.  Incisions clean dry and intact  Psychiatric: Normal affect, non-agitated, not confused       LABS:     Latest Ref Rng & Units 10/23/2024    2:59 AM 08/13/2020   11:56 PM 10/13/2018    7:14 PM  CMP  Glucose 70 - 99 mg/dL 98  94  94   BUN 6 - 20 mg/dL 17  13  17    Creatinine 0.61 - 1.24 mg/dL 8.83  8.94  8.86   Sodium 135 - 145 mmol/L 142  139  137   Potassium 3.5 - 5.1 mmol/L 4.0  3.3  3.6   Chloride 98 - 111 mmol/L 101  106  101   CO2 22 - 32 mmol/L 27  25  27    Calcium 8.9 - 10.3 mg/dL 9.7  8.7  9.4   Total Protein 6.5 - 8.1 g/dL 7.7  7.8  8.2   Total Bilirubin 0.0 - 1.2 mg/dL 0.9  1.7  1.2   Alkaline Phos 38 - 126 U/L 67  56  55   AST 15 - 41 U/L 19  17  68   ALT 0 - 44 U/L 29  30  108       Latest Ref Rng & Units 10/23/2024    2:59 AM 08/13/2020   11:56 PM 10/13/2018    7:14 PM  CBC  WBC 4.0 - 10.5 K/uL  9.4  9.7  14.3   Hemoglobin 13.0 - 17.0 g/dL 83.6  84.9  82.6   Hematocrit 39.0 - 52.0 % 47.3  41.1  49.9   Platelets 150 - 400 K/uL 246  184  282     RADS: N/a Assessment:   Status post robotic assisted laparoscopic cholecystectomy.  Doing well.  Advancing to regular diet.  1 more day of IV antibiotics prior to discharge.  All questions addressed.  Discontinue IV fluids.  labs/images/medications/previous chart entries reviewed personally and relevant changes/updates noted above.

## 2024-10-24 NOTE — Plan of Care (Signed)
  Problem: Education: Goal: Knowledge of General Education information will improve Description: Including pain rating scale, medication(s)/side effects and non-pharmacologic comfort measures Outcome: Progressing   Problem: Clinical Measurements: Goal: Respiratory complications will improve Outcome: Progressing   Problem: Activity: Goal: Risk for activity intolerance will decrease Outcome: Progressing   Problem: Nutrition: Goal: Adequate nutrition will be maintained Outcome: Progressing   Problem: Coping: Goal: Level of anxiety will decrease Outcome: Progressing   Problem: Elimination: Goal: Will not experience complications related to bowel motility Outcome: Progressing Goal: Will not experience complications related to urinary retention Outcome: Progressing   Problem: Pain Managment: Goal: General experience of comfort will improve and/or be controlled Outcome: Progressing   Problem: Safety: Goal: Ability to remain free from injury will improve Outcome: Progressing   Problem: Skin Integrity: Goal: Risk for impaired skin integrity will decrease Outcome: Progressing

## 2024-10-25 NOTE — Discharge Summary (Signed)
 Physician Discharge Summary  Patient ID: Jerry Collins MRN: [id number] DOB/AGE: [birth year] 28 y.o.  Admit date: 10/23/2024 Discharge date: 10/25/2024  Admission Diagnoses: Acute cholecystitis  Discharge Diagnoses:  Same as above  Discharged Condition: good  Hospital Course: admitted for above. Underwent surgery.  Please see op note for details.  Post op, recovered as expected.  Continued postoperative IV antibiotics for further infection control prior to discharge.  At time of discharge, tolerating diet, pain controlled, bowel movement reported as well.  Consults: None  Discharge Exam: Blood pressure 98/63, pulse (!) 57, temperature (!) 97.4 F (36.3 C), resp. rate 18, height 6' (1.829 m), weight 127 kg, SpO2 99%. General appearance: alert, cooperative, and no distress GI: Soft, no guarding, tenderness to palpation around incision and JP site as expected.  JP serosanguineous discharge  Disposition:  Discharge disposition: 01-Home or Self Care       Discharge Instructions     Call MD for:  difficulty breathing, headache or visual disturbances   Complete by: As directed    Call MD for:  persistant nausea and vomiting   Complete by: As directed    Call MD for:  redness, tenderness, or signs of infection (pain, swelling, redness, odor or green/yellow discharge around incision site)   Complete by: As directed    Call MD for:  severe uncontrolled pain   Complete by: As directed    Call MD for:  temperature >100.4   Complete by: As directed    Diet general   Complete by: As directed    Regular home diet as tolerated   Discharge instructions   Complete by: As directed    1.  Patient may shower, but do not scrub wounds heavily and dab dry only. 2.  Do not submerge wounds in pool/tub until fully healed. 3.  Do not apply ointments or hydrogen peroxide to the wounds. 4.  May apply ice packs to the wounds for comfort. 5.  Please empty/recharge and record drain output twice daily  and as needed to keep the bulb only half full at the most.  6.  Change drain dressing with 4x4 gauze and tape daily.   Driving Restrictions   Complete by: As directed    Do not drive while taking narcotics for pain control.  Prior to driving, make sure you are able to rotate right and left to look at blindspots without significant pain or discomfort.   Increase activity slowly   Complete by: As directed    Lifting restrictions   Complete by: As directed    No heavy lifting or pushing of more than 10-15 lbs for 4 weeks.      Allergies as of 10/25/2024   No Known Allergies      Medication List     STOP taking these medications    ciprofloxacin  500 MG tablet Commonly known as: Cipro    dexmethylphenidate 10 MG tablet Commonly known as: FOCALIN   polyethylene glycol 17 g packet Commonly known as: MiraLax    terbinafine  250 MG tablet Commonly known as: LamISIL    traMADol  50 MG tablet Commonly known as: Ultram        TAKE these medications    acetaminophen  500 MG tablet Commonly known as: TYLENOL  Take 2 tablets (1,000 mg total) by mouth every 6 (six) hours as needed for mild pain (pain score 1-3).   amoxicillin -clavulanate 875-125 MG tablet Commonly known as: AUGMENTIN  Take 1 tablet by mouth 2 (two) times daily for 7 days.   amphetamine-dextroamphetamine  20 MG tablet Commonly known as: ADDERALL Take 20 mg by mouth daily.   ibuprofen  600 MG tablet Commonly known as: ADVIL  Take 1 tablet (600 mg total) by mouth every 8 (eight) hours as needed for moderate pain (pain score 4-6).   lisdexamfetamine 70 MG capsule Commonly known as: VYVANSE Take 70 mg by mouth daily.   omeprazole 40 MG capsule Commonly known as: PRILOSEC Take 40 mg by mouth daily as needed.   oxyCODONE  5 MG immediate release tablet Commonly known as: Oxy IR/ROXICODONE  Take 1 tablet (5 mg total) by mouth every 4 (four) hours as needed for severe pain (pain score 7-10).        Follow-up  Information     Desiderio Schanz, MD Follow up on 11/02/2024.   Specialty: General Surgery Why: Appointment scheduled for 11/02/24 at 4:15 PM. Please arrive 15 min early. Contact information: [address] [place] [address] [phone]                  Total time spent arranging discharge was >24min. Signed: Henriette Pierre 10/25/2024, 9:39 AM

## 2024-10-25 NOTE — Plan of Care (Addendum)
 Teaching on JP was provided Problem: Education: Goal: Knowledge of General Education information will improve Description: Including pain rating scale, medication(s)/side effects and non-pharmacologic comfort measures Outcome: Progressing   Problem: Health Behavior/Discharge Planning: Goal: Ability to manage health-related needs will improve Outcome: Progressing   Problem: Clinical Measurements: Goal: Ability to maintain clinical measurements within normal limits will improve Outcome: Progressing Goal: Will remain free from infection Outcome: Progressing Goal: Diagnostic test results will improve Outcome: Progressing Goal: Respiratory complications will improve Outcome: Progressing Goal: Cardiovascular complication will be avoided Outcome: Progressing   Problem: Activity: Goal: Risk for activity intolerance will decrease Outcome: Progressing   Problem: Nutrition: Goal: Adequate nutrition will be maintained Outcome: Progressing   Problem: Coping: Goal: Level of anxiety will decrease Outcome: Progressing   Problem: Elimination: Goal: Will not experience complications related to bowel motility Outcome: Progressing Goal: Will not experience complications related to urinary retention Outcome: Progressing   Problem: Pain Managment: Goal: General experience of comfort will improve and/or be controlled Outcome: Progressing   Problem: Safety: Goal: Ability to remain free from injury will improve Outcome: Progressing   Problem: Skin Integrity: Goal: Risk for impaired skin integrity will decrease Outcome: Progressing

## 2024-10-25 NOTE — Progress Notes (Signed)
 Drain care and emptying education provided to patient and wife. Patient able to repeat instructions back to RN. Patient provided with additional dressing supplies for home. Patient able to express how to properly empty drain and recharge suction. Denies any further needs. AVS provided and no further needs identified. PIV removed. Patient discharged home in stable condition via private auto.

## 2024-10-27 LAB — SURGICAL PATHOLOGY

## 2024-11-02 ENCOUNTER — Encounter: Payer: Self-pay | Admitting: Surgery

## 2024-11-02 ENCOUNTER — Ambulatory Visit (INDEPENDENT_AMBULATORY_CARE_PROVIDER_SITE_OTHER): Admitting: Surgery

## 2024-11-02 VITALS — BP 138/89 | HR 87 | Temp 97.9°F | Ht 72.0 in | Wt 275.8 lb

## 2024-11-02 DIAGNOSIS — K810 Acute cholecystitis: Secondary | ICD-10-CM

## 2024-11-02 DIAGNOSIS — Z09 Encounter for follow-up examination after completed treatment for conditions other than malignant neoplasm: Secondary | ICD-10-CM

## 2024-11-02 NOTE — Patient Instructions (Signed)

## 2024-11-02 NOTE — Progress Notes (Signed)
 11/02/2024  HPI: Jerry Collins is a 28 y.o. male s/p robotic assisted cholecystectomy on 10/23/2024.  Patient had been admitted that same day with acute cholecystitis with symptoms ongoing for about 5 days.  Patient was discharged on 10/25/2024 with drain in the right upper quadrant in place.  Patient reports that he has been doing well and denies any worsening issues.  Reports soreness at the incisions particularly at the umbilicus.  The drain has been having low output of less than 1 ounce per day and has been serous.  Vital signs: BP 138/89   Pulse 87   Temp 97.9 F (36.6 C) (Oral)   Ht 6' (1.829 m)   Wt 275 lb 12.8 oz (125.1 kg)   SpO2 99%   BMI 37.41 kg/m    Physical Exam: Constitutional: No acute distress Abdomen: Soft, nondistended, appropriately sore to palpation.  Incisions are healing well and are clean, dry, intact.  Dermabond has peeled off.  Right upper quadrant drain with serous fluid.  This was removed at bedside without complications and a dry gauze dressing applied.  Assessment/Plan: This is a 28 y.o. male s/p robotic assisted cholecystectomy.  - Discussed with the patient that Swords is very appropriate based on the surgery and the inflammation response.  This will keep improving with time. - Drain was removed without complications.  Dry gauze dressing applied.  Instructed on daily dressing changes and as needed to keep the area clean and dry.  The drain site will seal up in a few days. - Discussed with him activity restrictions. - Follow-up as needed.   Jerry Sheree Plant, MD [facility]
